# Patient Record
Sex: Male | Born: 1975
Health system: Southern US, Community
[De-identification: ages and names within clinical notes are randomized; demographics above are authoritative.]

## PROBLEM LIST (undated history)

## (undated) ENCOUNTER — Emergency Department (HOSPITAL_COMMUNITY)
Admission: EM | Disposition: A | Discharge status: Still In Hospital | Payer: BC Managed Care – PPO | Source: Home / Self Care

## (undated) DIAGNOSIS — Z87442 Personal history of urinary calculi: Secondary | ICD-10-CM

## (undated) HISTORY — PX: NASAL SINUS SURGERY: SHX719

## (undated) HISTORY — PX: LITHOTRIPSY: SUR834

---

## 2005-05-05 ENCOUNTER — Emergency Department (HOSPITAL_COMMUNITY): Admission: EM | Admit: 2005-05-05 | Discharge: 2005-05-05 | Payer: Self-pay | Admitting: Emergency Medicine

## 2005-05-13 ENCOUNTER — Ambulatory Visit (HOSPITAL_COMMUNITY): Admission: RE | Admit: 2005-05-13 | Discharge: 2005-05-13 | Payer: Self-pay | Admitting: Urology

## 2006-05-16 IMAGING — CR DG ABDOMEN 1V
2 series · 2 of 2 positions shown · non-contrast
Comparison: none

CLINICAL DATA: Preop. 
 FLAT ABDOMEN ? 1 VIEW ([DATE] HOURS):

[view not recorded (1 of 2)]
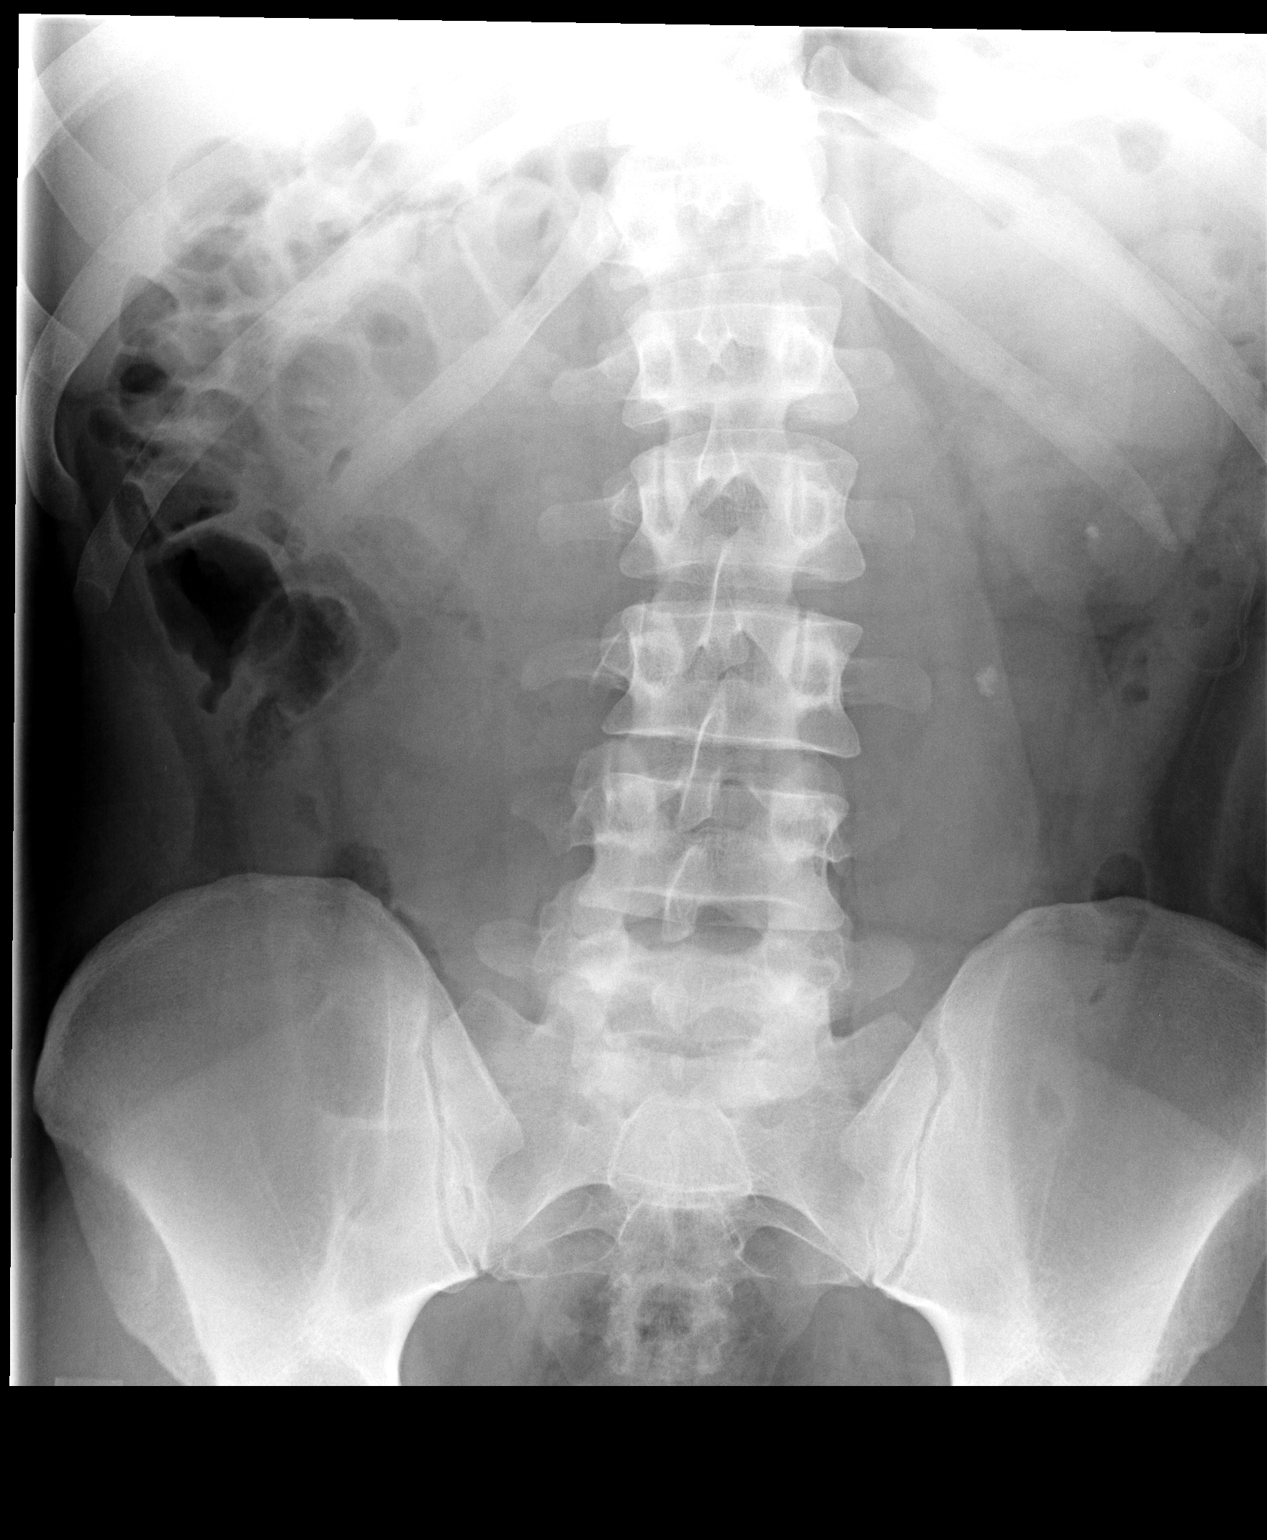

[view not recorded (2 of 2)]
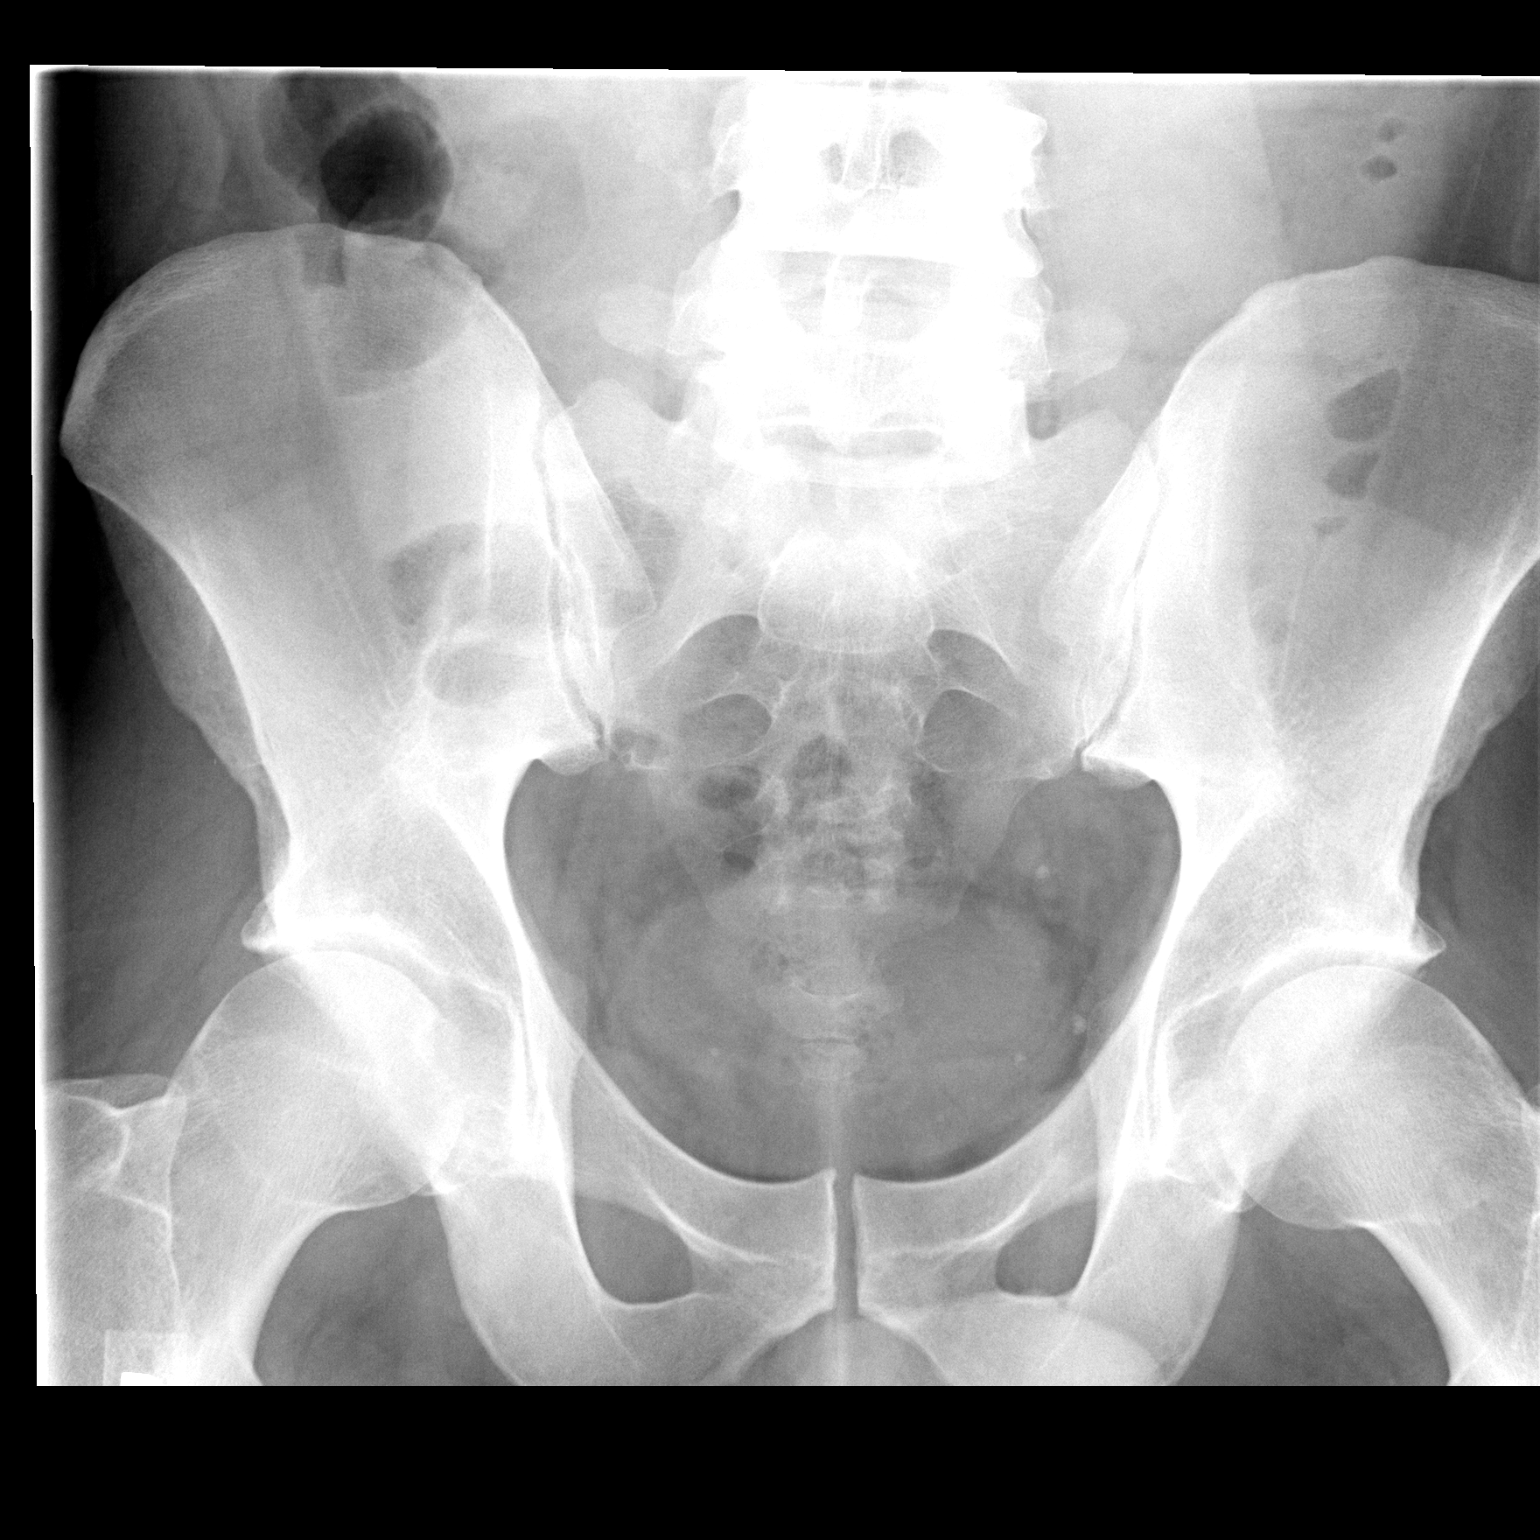

[2 of 2 positions shown; findings below may reference images not displayed]

FINDINGS: An 8 mm calcific density projects over the left ureter at the L-3 transverse process level compatible with the patient?s known left ureteral calculus.  Small calculi are present in the left kidney.  In the lower pole there is a 3 mm calculus.  In the interpolar region there is a 2 mm calculus.  The bowel gas pattern is unremarkable.  Mild degenerative changes are present in the spine.  Probable phleboliths project over the pelvis.
IMPRESSION: Left nephrolithiasis and left ureteral calculus.

## 2006-10-06 ENCOUNTER — Ambulatory Visit (HOSPITAL_COMMUNITY): Admission: RE | Admit: 2006-10-06 | Discharge: 2006-10-06 | Payer: Self-pay | Admitting: Urology

## 2013-02-26 ENCOUNTER — Encounter (HOSPITAL_BASED_OUTPATIENT_CLINIC_OR_DEPARTMENT_OTHER): Payer: Self-pay | Admitting: Emergency Medicine

## 2013-02-26 ENCOUNTER — Emergency Department (HOSPITAL_BASED_OUTPATIENT_CLINIC_OR_DEPARTMENT_OTHER)
Admission: EM | Admit: 2013-02-26 | Discharge: 2013-02-26 | Disposition: A | Discharge status: Home / Self Care | Payer: BC Managed Care – PPO | Attending: Emergency Medicine | Admitting: Emergency Medicine

## 2013-02-26 ENCOUNTER — Emergency Department (HOSPITAL_BASED_OUTPATIENT_CLINIC_OR_DEPARTMENT_OTHER): Payer: BC Managed Care – PPO

## 2013-02-26 DIAGNOSIS — Z87442 Personal history of urinary calculi: Secondary | ICD-10-CM | POA: Insufficient documentation

## 2013-02-26 DIAGNOSIS — S62639B Displaced fracture of distal phalanx of unspecified finger, initial encounter for open fracture: Secondary | ICD-10-CM | POA: Insufficient documentation

## 2013-02-26 DIAGNOSIS — Z79899 Other long term (current) drug therapy: Secondary | ICD-10-CM | POA: Insufficient documentation

## 2013-02-26 DIAGNOSIS — S62626B Displaced fracture of medial phalanx of right little finger, initial encounter for open fracture: Secondary | ICD-10-CM

## 2013-02-26 DIAGNOSIS — Y9389 Activity, other specified: Secondary | ICD-10-CM | POA: Insufficient documentation

## 2013-02-26 DIAGNOSIS — W230XXA Caught, crushed, jammed, or pinched between moving objects, initial encounter: Secondary | ICD-10-CM | POA: Insufficient documentation

## 2013-02-26 DIAGNOSIS — Y9289 Other specified places as the place of occurrence of the external cause: Secondary | ICD-10-CM | POA: Insufficient documentation

## 2013-02-26 DIAGNOSIS — Z23 Encounter for immunization: Secondary | ICD-10-CM | POA: Insufficient documentation

## 2013-02-26 DIAGNOSIS — R209 Unspecified disturbances of skin sensation: Secondary | ICD-10-CM | POA: Insufficient documentation

## 2013-02-26 LAB — BASIC METABOLIC PANEL
CO2: 29 mEq/L (ref 19–32)
GFR calc non Af Amer: >90 mL/min (ref >90)
Glucose, Bld: 101 mg/dL — ABNORMAL HIGH (ref 70–99)
Potassium: 4.4 mEq/L (ref 3.5–5.1)
Sodium: 142 mEq/L (ref 135–145)

## 2013-02-26 LAB — CBC WITH DIFFERENTIAL/PLATELET
Basophils Relative: 0 % (ref 0–1)
Eosinophils Relative: 1 % (ref 0–5)
Hemoglobin: 15.3 g/dL (ref 13.0–17.0)
Lymphocytes Relative: 13 % (ref 12–46)
Neutrophils Relative %: 80 % — ABNORMAL HIGH (ref 43–77)
Platelets: 262 10*3/uL (ref 150–400)
RBC: 4.78 MIL/uL (ref 4.22–5.81)
WBC: 11.2 10*3/uL — ABNORMAL HIGH (ref 4.0–10.5)

## 2013-02-26 MED ORDER — CEPHALEXIN 500 MG PO CAPS: 500.0000 mg | ORAL_CAPSULE | Freq: Four times a day (QID) | ORAL | Status: DC

## 2013-02-26 MED ORDER — OXYCODONE-ACETAMINOPHEN 5-325 MG PO TABS
2.0000 | ORAL_TABLET | Freq: Once | ORAL | Status: DC
  Filled 2013-02-26: qty 2

## 2013-02-26 MED ORDER — OXYCODONE-ACETAMINOPHEN 5-325 MG PO TABS: 1.0000 | ORAL_TABLET | ORAL | Status: DC | PRN

## 2013-02-26 MED ORDER — CEPHALEXIN 250 MG PO CAPS
250.0000 mg | ORAL_CAPSULE | Freq: Once | ORAL | Status: AC
  Administered 2013-02-26: 250 mg via ORAL
  Filled 2013-02-26: qty 1

## 2013-02-26 MED ORDER — TETANUS-DIPHTH-ACELL PERTUSSIS 5-2.5-18.5 LF-MCG/0.5 IM SUSP
INTRAMUSCULAR | Status: AC
  Filled 2013-02-26: qty 0.5

## 2013-02-26 MED ORDER — TETANUS-DIPHTH-ACELL PERTUSSIS 5-2.5-18.5 LF-MCG/0.5 IM SUSP
0.5000 mL | Freq: Once | INTRAMUSCULAR | Status: AC
  Administered 2013-02-26: 0.5 mL via INTRAMUSCULAR

## 2013-02-26 NOTE — ED Notes (Signed)
Smashed right 5th finger between a tractor and another piece of farm equipment.

## 2013-02-26 NOTE — ED Provider Notes (Signed)
History     CSN: [id number]  Arrival date & time 02/26/13  1952   First MD Initiated Contact with Patient 02/26/13 2024      Chief Complaint  Patient presents with  . Finger Injury    (Consider location/radiation/quality/duration/timing/severity/associated sxs/prior treatment) HPI Comments: Patient is a 37 year old male who presents with right 5th finger pain that occurred prior to arrival. The pain started suddenly when the patient had his finger smashed between a tractor and another piece of farm equipment. The pain is throbbing and moderate without radiation. Patient reports overlying laceration of the affected area. Patient reports tingling and numbness to the tip of his right fifth finger. No other injuries. Movement and palpation of the area makes the pain worse. No alleviating factors.    Past Medical History  Diagnosis Date  . Kidney stone     Past Surgical History  Procedure Laterality Date  . Nasal sinus surgery    . Lithotripsy      No family history on file.  History  Substance Use Topics  . Smoking status: Never Smoker   . Smokeless tobacco: Current User  . Alcohol Use: Yes     Comment: 2x/wk      Review of Systems  Musculoskeletal: Positive for arthralgias.  Skin: Positive for wound.  All other systems reviewed and are negative.    Allergies  Shrimp  Home Medications   Current Outpatient Rx  Name  Route  Sig  Dispense  Refill  . lamoTRIgine (LAMICTAL) 100 MG tablet   Oral   Take 100 mg by mouth daily.         . Multiple Vitamin (MULTIVITAMIN WITH MINERALS) TABS   Oral   Take 1 tablet by mouth daily.           BP 135/74  Pulse 62  Temp(Src) 98.5 F (36.9 C) (Oral)  Resp 16  Ht 6' (1.829 m)  Wt 207 lb (93.895 kg)  BMI 28.07 kg/m2  SpO2 100%  Physical Exam  Nursing note and vitals reviewed. Constitutional: He appears well-developed and well-nourished. No distress.  HENT:  Head: Normocephalic and atraumatic.  Eyes:  Conjunctivae are normal.  Neck: Normal range of motion.  Cardiovascular: Normal rate and regular rhythm.  Exam reveals no gallop and no friction rub.   No murmur heard. Pulmonary/Chest: Effort normal and breath sounds normal. He has no wheezes. He has no rales. He exhibits no tenderness.  Musculoskeletal: Normal range of motion.  Neurological: He is alert. Coordination normal.  Patient reports numbness of tip of right fifth finger. Speech is goal-oriented. Moves limbs without ataxia.   Skin: Skin is warm and dry.  1 cm laceration over volar aspect of distal right 5th finger. Tenderness to palpation.   Psychiatric: He has a normal mood and affect. His behavior is normal.    ED Course  Procedures (including critical care time)  Labs Reviewed  CBC WITH DIFFERENTIAL  BASIC METABOLIC PANEL   Dg Finger Little Right  02/26/2013   *RADIOLOGY REPORT*  Clinical Data: Finger injury  RIGHT LITTLE FINGER 2+V  Comparison: None.  Findings: Three views of the right fifth finger submitted.  There is displaced fracture of the distal aspect middle phalanx.  IMPRESSION: Displaced fracture distal aspect of the middle phalanx right fifth finger.   Original Report Authenticated By: Natasha Mead, M.D.     1. Fracture of fifth finger, middle phalanx, right, open, initial encounter       MDM  8:56 PM Xray shows displaced fracture of distal middle phalanx of right fifth finger. Fracture is open. I will call hand surgery for patient disposition.   9:51 PM Dr. Melvyn Novas will surgically repair the fracture tomorrow. Patient will have Keflex. Patient given tetanus shot here. Patient will have wound cleaned and finger splint applied and be discharged.       Emilia Beck, PA-C 02/26/13 2156

## 2013-02-26 NOTE — ED Provider Notes (Signed)
History/physical exam/procedure(s) were performed by non-physician practitioner and as supervising physician I was immediately available for consultation/collaboration. I have reviewed all notes and am in agreement with care and plan.   Kahlee Metivier S Gilberte Gorley, MD 02/26/13 2207 

## 2013-02-27 ENCOUNTER — Ambulatory Visit (HOSPITAL_COMMUNITY)
Admission: RE | Admit: 2013-02-27 | Discharge: 2013-02-27 | Disposition: A | Discharge status: Home / Self Care | Payer: BC Managed Care – PPO | Source: Other Acute Inpatient Hospital | Attending: Orthopedic Surgery | Admitting: Orthopedic Surgery

## 2013-02-27 ENCOUNTER — Encounter (HOSPITAL_COMMUNITY): Payer: Self-pay | Admitting: Anesthesiology

## 2013-02-27 ENCOUNTER — Ambulatory Visit (HOSPITAL_COMMUNITY): Payer: BC Managed Care – PPO | Admitting: Anesthesiology

## 2013-02-27 ENCOUNTER — Encounter (HOSPITAL_COMMUNITY)
Admission: RE | Disposition: A | Discharge status: Home / Self Care | Payer: Self-pay | Source: Other Acute Inpatient Hospital | Attending: Orthopedic Surgery

## 2013-02-27 DIAGNOSIS — X58XXXA Exposure to other specified factors, initial encounter: Secondary | ICD-10-CM | POA: Insufficient documentation

## 2013-02-27 DIAGNOSIS — IMO0002 Reserved for concepts with insufficient information to code with codable children: Secondary | ICD-10-CM | POA: Insufficient documentation

## 2013-02-27 DIAGNOSIS — Z91013 Allergy to seafood: Secondary | ICD-10-CM | POA: Insufficient documentation

## 2013-02-27 DIAGNOSIS — F1010 Alcohol abuse, uncomplicated: Secondary | ICD-10-CM | POA: Insufficient documentation

## 2013-02-27 DIAGNOSIS — F17200 Nicotine dependence, unspecified, uncomplicated: Secondary | ICD-10-CM | POA: Insufficient documentation

## 2013-02-27 DIAGNOSIS — S61209A Unspecified open wound of unspecified finger without damage to nail, initial encounter: Secondary | ICD-10-CM | POA: Insufficient documentation

## 2013-02-27 HISTORY — PX: PERCUTANEOUS PINNING: SHX2209

## 2013-02-27 SURGERY — PINNING, EXTREMITY, PERCUTANEOUS
Anesthesia: General | Site: Finger | Laterality: Right | Wound class: Dirty or Infected

## 2013-02-27 MED ORDER — ONDANSETRON HCL 4 MG/2ML IJ SOLN
INTRAMUSCULAR | Status: DC | PRN
  Administered 2013-02-27: 4 mg via INTRAVENOUS

## 2013-02-27 MED ORDER — 0.9 % SODIUM CHLORIDE (POUR BTL) OPTIME
TOPICAL | Status: DC | PRN
  Administered 2013-02-27: 100 mL

## 2013-02-27 MED ORDER — LIDOCAINE HCL (CARDIAC) 20 MG/ML IV SOLN
INTRAVENOUS | Status: DC | PRN
  Administered 2013-02-27: 60 mg via INTRAVENOUS

## 2013-02-27 MED ORDER — MIDAZOLAM HCL 5 MG/5ML IJ SOLN
INTRAMUSCULAR | Status: DC | PRN
Start: 2013-02-27 — End: 2013-02-27
  Administered 2013-02-27: 2 mg via INTRAVENOUS

## 2013-02-27 MED ORDER — CHLORHEXIDINE GLUCONATE 4 % EX LIQD
60.0000 mL | Freq: Once | CUTANEOUS | Status: DC
  Filled 2013-02-27: qty 60

## 2013-02-27 MED ORDER — CEFAZOLIN SODIUM-DEXTROSE 2-3 GM-% IV SOLR
INTRAVENOUS | Status: AC
  Filled 2013-02-27: qty 50

## 2013-02-27 MED ORDER — BUPIVACAINE HCL (PF) 0.25 % IJ SOLN
INTRAMUSCULAR | Status: DC | PRN
  Administered 2013-02-27: 7 mL

## 2013-02-27 MED ORDER — PROPOFOL 10 MG/ML IV BOLUS
INTRAVENOUS | Status: DC | PRN
  Administered 2013-02-27: 200 mg via INTRAVENOUS

## 2013-02-27 MED ORDER — LACTATED RINGERS IV SOLN
INTRAVENOUS | Status: DC | PRN
  Administered 2013-02-27 (×2): via INTRAVENOUS

## 2013-02-27 MED ORDER — DEXTROSE 5 % IV SOLN
INTRAVENOUS | Status: DC | PRN
  Administered 2013-02-27: 19:00:00 via INTRAVENOUS

## 2013-02-27 MED ORDER — DEXAMETHASONE SODIUM PHOSPHATE 4 MG/ML IJ SOLN
INTRAMUSCULAR | Status: DC | PRN
  Administered 2013-02-27: 4 mg via INTRAVENOUS

## 2013-02-27 MED ORDER — OXYCODONE-ACETAMINOPHEN 10-325 MG PO TABS: 1.0000 | ORAL_TABLET | ORAL | Status: DC | PRN

## 2013-02-27 MED ORDER — CEFAZOLIN SODIUM-DEXTROSE 2-3 GM-% IV SOLR
INTRAVENOUS | Status: DC | PRN
  Administered 2013-02-27: 2 g via INTRAVENOUS

## 2013-02-27 MED ORDER — CEFAZOLIN SODIUM-DEXTROSE 2-3 GM-% IV SOLR
2.0000 g | INTRAVENOUS | Status: DC
  Filled 2013-02-27: qty 50

## 2013-02-27 MED ORDER — FENTANYL CITRATE 0.05 MG/ML IJ SOLN
INTRAMUSCULAR | Status: DC | PRN
  Administered 2013-02-27 (×3): 50 ug via INTRAVENOUS

## 2013-02-27 MED ORDER — HYDROMORPHONE HCL PF 1 MG/ML IJ SOLN: 0.2500 mg | INTRAMUSCULAR | Status: DC | PRN

## 2013-02-27 MED ORDER — DOCUSATE SODIUM 100 MG PO CAPS: 100.0000 mg | ORAL_CAPSULE | Freq: Two times a day (BID) | ORAL | Status: DC

## 2013-02-27 SURGICAL SUPPLY — 37 items
BANDAGE CONFORM 2  STR LF (GAUZE/BANDAGES/DRESSINGS) ×3 IMPLANT
BNDG CMPR 9X4 STRL LF SNTH (GAUZE/BANDAGES/DRESSINGS) ×1
BNDG COHESIVE 1X5 TAN STRL LF (GAUZE/BANDAGES/DRESSINGS) ×3 IMPLANT
BNDG ESMARK 4X9 LF (GAUZE/BANDAGES/DRESSINGS) ×3 IMPLANT
CLOTH BEACON ORANGE TIMEOUT ST (SAFETY) ×3 IMPLANT
CORDS BIPOLAR (ELECTRODE) ×3 IMPLANT
COVER SURGICAL LIGHT HANDLE (MISCELLANEOUS) ×3 IMPLANT
CUFF TOURNIQUET SINGLE 18IN (TOURNIQUET CUFF) ×3 IMPLANT
DRAPE OEC MINIVIEW 54X84 (DRAPES) ×3 IMPLANT
DRSG TUBE GAUZE 1X5YD SZ2 (GAUZE/BANDAGES/DRESSINGS) ×3 IMPLANT
GAUZE XEROFORM 1X8 LF (GAUZE/BANDAGES/DRESSINGS) ×3 IMPLANT
GLOVE BIO SURGEON STRL SZ7.5 (GLOVE) ×3 IMPLANT
GLOVE BIO SURGEON STRL SZ8 (GLOVE) ×3 IMPLANT
GLOVE BIOGEL PI IND STRL 8 (GLOVE) ×2 IMPLANT
GLOVE BIOGEL PI IND STRL 8.5 (GLOVE) ×2 IMPLANT
GLOVE BIOGEL PI INDICATOR 8 (GLOVE) ×1
GLOVE BIOGEL PI INDICATOR 8.5 (GLOVE) ×1
GLOVE SURG ORTHO 8.0 STRL STRW (GLOVE) ×3 IMPLANT
GOWN PREVENTION PLUS XLARGE (GOWN DISPOSABLE) ×3 IMPLANT
GOWN STRL NON-REIN LRG LVL3 (GOWN DISPOSABLE) ×6 IMPLANT
GOWN STRL REIN 3XL LVL4 (GOWN DISPOSABLE) ×3 IMPLANT
K-WIRE .035X5.75  STT K (WIRE) ×9 IMPLANT
K-WIRE .045 CH (WIRE) ×3
KIT BASIN OR (CUSTOM PROCEDURE TRAY) ×3 IMPLANT
KIT ROOM TURNOVER OR (KITS) ×3 IMPLANT
KWIRE .045 CH (WIRE) ×2 IMPLANT
NS IRRIG 1000ML POUR BTL (IV SOLUTION) ×3 IMPLANT
PACK ORTHO EXTREMITY (CUSTOM PROCEDURE TRAY) ×3 IMPLANT
PAD ARMBOARD 7.5X6 YLW CONV (MISCELLANEOUS) ×6 IMPLANT
SPONGE GAUZE 4X4 12PLY (GAUZE/BANDAGES/DRESSINGS) ×3 IMPLANT
SUT MERSILENE 4 0 P 3 (SUTURE) ×6 IMPLANT
SUT PROLENE 4 0 P 3 18 (SUTURE) ×3 IMPLANT
SUT PROLENE 5 0 PS 2 (SUTURE) ×6 IMPLANT
TOWEL OR 17X24 6PK STRL BLUE (TOWEL DISPOSABLE) ×3 IMPLANT
TOWEL OR 17X26 10 PK STRL BLUE (TOWEL DISPOSABLE) ×3 IMPLANT
TUBE CONNECTING 12X1/4 (SUCTIONS) ×3 IMPLANT
TUBE GAUZE SZ 6 (GAUZE/BANDAGES/DRESSINGS) IMPLANT

## 2013-02-27 NOTE — H&P (Signed)
Johnny Dean is an 37 y.o. male.   Chief Complaint: right small finger pain and injury HPI: Pt injured last evening and sustained open middle phalanx fracture Pt here for surgery to fix deformity and open fracture No prior injury to right small finger Received TD and ABx last night  Past Medical History  Diagnosis Date  . Kidney stone     Past Surgical History  Procedure Laterality Date  . Nasal sinus surgery    . Lithotripsy      No family history on file. Social History:  reports that he has never smoked. He uses smokeless tobacco. He reports that  drinks alcohol. He reports that he does not use illicit drugs.  Allergies:  Allergies  Allergen Reactions  . Shrimp (Shellfish Allergy)     Medications Prior to Admission  Medication Sig Dispense Refill  . cephALEXin (KEFLEX) 500 MG capsule Take 1 capsule (500 mg total) by mouth 4 (four) times daily.  40 capsule  0  . lamoTRIgine (LAMICTAL) 100 MG tablet Take 100 mg by mouth daily.      . Multiple Vitamin (MULTIVITAMIN WITH MINERALS) TABS Take 1 tablet by mouth daily.      Marland Kitchen oxyCODONE-acetaminophen (PERCOCET/ROXICET) 5-325 MG per tablet Take 1 tablet by mouth every 4 (four) hours as needed for pain.  6 tablet  0    Results for orders placed during the hospital encounter of 02/26/13 (from the past 48 hour(s))  CBC WITH DIFFERENTIAL     Status: Abnormal   Collection Time    02/26/13 10:00 PM      Result Value Range   WBC 11.2 (*) 4.0 - 10.5 K/uL   RBC 4.78  4.22 - 5.81 MIL/uL   Hemoglobin 15.3  13.0 - 17.0 g/dL   HCT 16.1  09.6 - 04.5 %   MCV 90.8  78.0 - 100.0 fL   MCH 32.0  26.0 - 34.0 pg   MCHC 35.3  30.0 - 36.0 g/dL   RDW 40.9  81.1 - 91.4 %   Platelets 262  150 - 400 K/uL   Neutrophils Relative % 80 (*) 43 - 77 %   Lymphocytes Relative 13  12 - 46 %   Monocytes Relative 6  3 - 12 %   Eosinophils Relative 1  0 - 5 %   Basophils Relative 0  0 - 1 %   Neutro Abs 8.9 (*) 1.7 - 7.7 K/uL   Lymphs Abs 1.5  0.7 - 4.0  K/uL   Monocytes Absolute 0.7  0.1 - 1.0 K/uL   Eosinophils Absolute 0.1  0.0 - 0.7 K/uL   Basophils Absolute 0.0  0.0 - 0.1 K/uL   Smear Review MORPHOLOGY UNREMARKABLE    BASIC METABOLIC PANEL     Status: Abnormal   Collection Time    02/26/13 10:00 PM      Result Value Range   Sodium 142  135 - 145 mEq/L   Potassium 4.4  3.5 - 5.1 mEq/L   Chloride 103  96 - 112 mEq/L   CO2 29  19 - 32 mEq/L   Glucose, Bld 101 (*) 70 - 99 mg/dL   BUN 9  6 - 23 mg/dL   Creatinine, Ser 7.82  0.50 - 1.35 mg/dL   Calcium 95.6  8.4 - 21.3 mg/dL   GFR calc non Af Amer >90  >90 mL/min   GFR calc Af Amer >90  >90 mL/min   Comment:  The eGFR has been calculated     using the CKD EPI equation.     This calculation has not been     validated in all clinical     situations.     eGFR's persistently     <90 mL/min signify     possible Chronic Kidney Disease.   Dg Finger Little Right  02/26/2013   *RADIOLOGY REPORT*  Clinical Data: Finger injury  RIGHT LITTLE FINGER 2+V  Comparison: None.  Findings: Three views of the right fifth finger submitted.  There is displaced fracture of the distal aspect middle phalanx.  IMPRESSION: Displaced fracture distal aspect of the middle phalanx right fifth finger.   Original Report Authenticated By: Natasha Mead, M.D.    NO RECENT ILLNESSES OR HOSPITALIZATIONS  Blood pressure 117/64, pulse 64, temperature 98.3 F (36.8 C), temperature source Oral, SpO2 96.00%. General Appearance:  Alert, cooperative, no distress, appears stated age  Head:  Normocephalic, without obvious abnormality, atraumatic  Eyes:  Pupils equal, conjunctiva/corneas clear,         Throat: Lips, mucosa, and tongue normal; teeth and gums normal  Neck: No visible masses     Lungs:   respirations unlabored  Chest Wall:  No tenderness or deformity  Heart:  Regular rate and rhythm,  Abdomen:   Soft, non-tender,         Extremities: RIGHT SMALL FINGER IN BANDAGE, LIMITED MOBILITY NO INJURY TO  LONG,RING,INDEX FINGER TIP WARM WELL PERFUSED  Pulses: 2+ and symmetric  Skin: Skin color, texture, turgor normal, no rashes or lesions     Neurologic: Normal    Assessment/Plan RIGHT SMALL FINGER OPEN MIDDLE PHALANX FRACTURE  RIGHT SMALL FINGER OPEN DEBRIDEMENT AND REPAIR AS INDICATED  R/B/A DISCUSSED WITH PT IN HOLDING AREA.  PT VOICED UNDERSTANDING OF PLAN CONSENT SIGNED DAY OF SURGERY PT SEEN AND EXAMINED PRIOR TO OPERATIVE PROCEDURE/DAY OF SURGERY SITE MARKED. QUESTIONS ANSWERED WILL GO HOME FOLLOWING SURGERY  Sharma Covert 02/27/2013, 7:48 PM

## 2013-02-27 NOTE — Anesthesia Preprocedure Evaluation (Addendum)
Anesthesia Evaluation   Patient awake    Reviewed: Allergy & Precautions, H&P , NPO status , Patient's Chart, lab work & pertinent test results  Airway Mallampati: II      Dental   Pulmonary neg pulmonary ROS, Current Smoker (smokeless tobacco),          Cardiovascular negative cardio ROS  Rhythm:Regular Rate:Normal     Neuro/Psych S/p brain tumor resection  negative neurological ROS  negative psych ROS   GI/Hepatic negative GI ROS, Neg liver ROS, (+)     substance abuse  alcohol use,   Endo/Other  negative endocrine ROS  Renal/GU Renal diseaseHistory of kidneys stones noted.   Kidney stones    Musculoskeletal   Abdominal   Peds  Hematology negative hematology ROS (+)   Anesthesia Other Findings   Reproductive/Obstetrics negative OB ROS                      Anesthesia Physical Anesthesia Plan  ASA: II  Anesthesia Plan: General   Post-op Pain Management:    Induction:   Airway Management Planned: LMA  Additional Equipment:   Intra-op Plan:   Post-operative Plan: Extubation in OR  Informed Consent: I have reviewed the patients History and Physical, chart, labs and discussed the procedure including the risks, benefits and alternatives for the proposed anesthesia with the patient or authorized representative who has indicated his/her understanding and acceptance.   Dental advisory given  Plan Discussed with: CRNA, Anesthesiologist and Surgeon  Anesthesia Plan Comments:        Anesthesia Quick Evaluation

## 2013-02-27 NOTE — Brief Op Note (Signed)
02/27/2013  7:50 PM  PATIENT:  Johnny Dean  37 y.o. male  PRE-OPERATIVE DIAGNOSIS:  right small finger fracture  POST-OPERATIVE DIAGNOSIS: SAME  PROCEDURE:  RIGHT SMALL FINGER OPEN REDUCTION AND INTERNAL FIXATION  SURGEON:  Surgeon(s) and Role:    * Sharma Covert, MD - Primary  PHYSICIAN ASSISTANT: NONE  ASSISTANTS: none   ANESTHESIA:   general  EBL:   NONE  BLOOD ADMINISTERED:none  DRAINS: none   LOCAL MEDICATIONS USED:  MARCAINE     SPECIMEN:  No Specimen  DISPOSITION OF SPECIMEN:  N/A  COUNTS:  YES  TOURNIQUET:    DICTATION: .284132  PLAN OF CARE: Discharge to home after PACU  PATIENT DISPOSITION:  PACU - hemodynamically stable.   Delay start of Pharmacological VTE agent (>24hrs) due to surgical blood loss or risk of bleeding: not applicable

## 2013-02-27 NOTE — Preoperative (Signed)
Beta Blockers   Reason not to administer Beta Blockers:Not Applicable 

## 2013-02-27 NOTE — Anesthesia Postprocedure Evaluation (Signed)
  Anesthesia Post-op Note  Patient: Johnny Dean  Procedure(s) Performed: Procedure(s): PERCUTANEOUS PINNING EXTREMITY (Right)  Patient Location: PACU  Anesthesia Type:General  Level of Consciousness: awake  Airway and Oxygen Therapy: Patient Spontanous Breathing  Post-op Pain: mild  Post-op Assessment: Post-op Vital signs reviewed  Post-op Vital Signs: Reviewed  Complications: No apparent anesthesia complications

## 2013-02-27 NOTE — Op Note (Deleted)
NAME:  Johnny Dean, Johnny Dean           ACCOUNT NO.:  [id number]  MEDICAL RECORD NO.:  [id number]  LOCATION:  [facility]                         FACILITY:  [facility]  PHYSICIAN:  Craige Patel W Lenette Rau IV, MD  DATE OF BIRTH:  02/07/1976  DATE OF PROCEDURE:  02/27/2013 DATE OF DISCHARGE:  02/27/2013                              OPERATIVE REPORT   PREOPERATIVE DIAGNOSES: 1. Right small finger open middle phalanx fracture 2. Right small finger extensor tendon laceration, zone I/II.  POSTOPERATIVE DIAGNOSES: 1. Right small finger open middle phalanx fracture. 2. Right small finger extensor tendon laceration, zone I/II.  ATTENDING PHYSICIAN:  Miral Hoopes W Korrie Hofbauer IV, MD, who scrubbed and present for the entire procedure.  ASSISTANT SURGEON:  None.  ANESTHESIA:  General via LMA.  TOURNIQUET TIME:  One hour at 250 mmHg.  SURGICAL IMPLANTS:  Two 0.035 K-wires.  SURGICAL PROCEDURE: 1. Open debridement of skin, subcutaneous tissue, and bone associated     with open fracture, right small finger. 2. Open treatment of right small finger, middle phalanx fracture     involving the articular surface, and distal interphalangeal joint. 3. Right small finger zone I/II extensor tendon repair. 4. Radiograph 2 views, right small finger. 5. Traumatic laceration repair, 2 cm volar wound.  SURGICAL INDICATIONS:  Mr. Johnny Dean is a right-hand-dominant gentleman, who sustained a crushing injury to his right small finger. The patient was seen and evaluated and given displacement of the nature of his injury, I recommended to undergo the above procedure.  Risks, benefits, and alternatives were discussed in detail with the patient. Signed informed consent was obtained.  Risks include, but not limited to bleeding, infection, damage to nearby nerves, arteries, or tendons, loss of motion of wrist and digits, incomplete relief of symptoms, nonunion, malunion, hardware failure, and need for further surgical  intervention.  DESCRIPTION OF PROCEDURE:  The patient was properly identified in the preop holding area.  Mark with permanent marker made on the right small finger to indicate the correct operative site.  The patient was then brought back to operating room, placed supine on the anesthesia room table.  General anesthesia was administered.  The patient received preoperative antibiotics prior to skin incision.  A well-padded tourniquet was then placed in the right brachium and sealed with 1000 drape.  Right upper extremity was then prepped and draped in normal sterile fashion.  Time-out was called.  Correct side was identified and procedure then begun.  Attention then turned to the right small finger where a Z-type incision made directly over the dorsal aspect of the distal interphalangeal joint.  The patient did have a transverse laceration that was extended both proximally and distally on this side. The dissection was then carried down through the skin and subcutaneous tissue.  The patient did have the extensor tendon laceration zone I/II just proximal to the distal interphalangeal joint.  Once this was carried out, the wound was then thoroughly irrigated.  Excisional debridement was then carried out of the skin and subcutaneous tissue and bone associated with the open fractures with excisional debridement carried out with small curettes, rongeurs, and knife.  After open debridement of open fracture, an open reduction was then performed.  A cross   0.035 K-wire was then placed in crossed fashion.  Following this, a 0.035 K-wire was then placed retrograde from the distal phalanx across the distal interphalangeal joint into the middle phalanx.  This obtained good reduction.  Following this, the wound was then irrigated.  The extensor mechanism was then repaired with 4-0 Mersilene suture, figure- of-eight sutures.  Final radiographs were then obtained.  K-wires were then cut and bent and a  crossing K-wire was left out of the skin.  The longitudinal K-wire was cut beneath the skin.  The volar wound, traumatic wound was then closed.  A 2 cm wound was then closed with simple Prolene sutures.  Xeroform dressings were applied.  Sterile compressive bandage applied.  The patient was placed in a finger splint, 7 mL of 0.25% Marcaine was infiltrated locally.  The patient was then extubated and taken to recovery room in good condition.  Postoperative radiographs, 2 views of the finger do show the K-wires fixation in place.  There was good position in both planes.  POSTPROCEDURE PLAN:  The patient will be discharged to home, seen back in the office in approximately 10 days for wound check, likely suture removal, x-rays, and then may be sent down to see our therapist for a small finger tip protector splint.  These were the cross K-wire out of the 4 week mark, longitudinal K-wire out at the 6-8 week mark, radiographs at each visit.  I then begin a zone I extensor tendon repair protocol once the K-wire was taken out.     Michael Walrath W Rashaun Wichert IV, MD     FWO/MEDQ  D:  02/27/2013  T:  02/27/2013  Job:  [id number] 

## 2013-02-27 NOTE — Transfer of Care (Signed)
Immediate Anesthesia Transfer of Care Note  Patient: Johnny Dean  Procedure(s) Performed: Procedure(s): PERCUTANEOUS PINNING EXTREMITY (Right)  Patient Location: PACU  Anesthesia Type:General  Level of Consciousness: awake, alert , oriented and patient cooperative  Airway & Oxygen Therapy: Patient Spontanous Breathing and Patient connected to nasal cannula oxygen  Post-op Assessment: Report given to PACU RN and Post -op Vital signs reviewed and stable  Post vital signs: Reviewed and stable  Complications: No apparent anesthesia complications

## 2013-02-27 NOTE — Op Note (Signed)
NAME:  Johnny Dean, Johnny Dean NO.:  [id number]  MEDICAL RECORD NO.:  [id number]  LOCATION:  [facility]                         FACILITY:  [facility]  PHYSICIAN:  Madelynn Done, MD  DATE OF BIRTH:  03/01/1976  DATE OF PROCEDURE:  02/27/2013 DATE OF DISCHARGE:  02/27/2013                              OPERATIVE REPORT   PREOPERATIVE DIAGNOSES: 1. Right small finger open middle phalanx fracture 2. Right small finger extensor tendon laceration, zone I/II.  POSTOPERATIVE DIAGNOSES: 1. Right small finger open middle phalanx fracture. 2. Right small finger extensor tendon laceration, zone I/II.  ATTENDING PHYSICIAN:  Madelynn Done, MD, who scrubbed and present for the entire procedure.  ASSISTANT SURGEON:  None.  ANESTHESIA:  General via LMA.  TOURNIQUET TIME:  One hour at 250 mmHg.  SURGICAL IMPLANTS:  Two 0.035 K-wires.  SURGICAL PROCEDURE: 1. Open debridement of skin, subcutaneous tissue, and bone associated     with open fracture, right small finger. 2. Open treatment of right small finger, middle phalanx fracture     involving the articular surface, and distal interphalangeal joint. 3. Right small finger zone I/II extensor tendon repair. 4. Radiograph 2 views, right small finger. 5. Traumatic laceration repair, 2 cm volar wound.  SURGICAL INDICATIONS:  Johnny Dean is a right-hand-dominant gentleman, who sustained a crushing injury to his right small finger. The patient was seen and evaluated and given displacement of the nature of his injury, I recommended to undergo the above procedure.  Risks, benefits, and alternatives were discussed in detail with the patient. Signed informed consent was obtained.  Risks include, but not limited to bleeding, infection, damage to nearby nerves, arteries, or tendons, loss of motion of wrist and digits, incomplete relief of symptoms, nonunion, malunion, hardware failure, and need for further surgical  intervention.  DESCRIPTION OF PROCEDURE:  The patient was properly identified in the preop holding area.  Mark with permanent marker made on the right small finger to indicate the correct operative site.  The patient was then brought back to operating room, placed supine on the anesthesia room table.  General anesthesia was administered.  The patient received preoperative antibiotics prior to skin incision.  A well-padded tourniquet was then placed in the right brachium and sealed with 1000 drape.  Right upper extremity was then prepped and draped in normal sterile fashion.  Time-out was called.  Correct side was identified and procedure then begun.  Attention then turned to the right small finger where a Z-type incision made directly over the dorsal aspect of the distal interphalangeal joint.  The patient did have a transverse laceration that was extended both proximally and distally on this side. The dissection was then carried down through the skin and subcutaneous tissue.  The patient did have the extensor tendon laceration zone I/II just proximal to the distal interphalangeal joint.  Once this was carried out, the wound was then thoroughly irrigated.  Excisional debridement was then carried out of the skin and subcutaneous tissue and bone associated with the open fractures with excisional debridement carried out with small curettes, rongeurs, and knife.  After open debridement of open fracture, an open reduction was then performed.  A cross  0.035 K-wire was then placed in crossed fashion.  Following this, a 0.035 K-wire was then placed retrograde from the distal phalanx across the distal interphalangeal joint into the middle phalanx.  This obtained good reduction.  Following this, the wound was then irrigated.  The extensor mechanism was then repaired with 4-0 Mersilene suture, figure- of-eight sutures.  Final radiographs were then obtained.  K-wires were then cut and bent and a  crossing K-wire was left out of the skin.  The longitudinal K-wire was cut beneath the skin.  The volar wound, traumatic wound was then closed.  A 2 cm wound was then closed with simple Prolene sutures.  Xeroform dressings were applied.  Sterile compressive bandage applied.  The patient was placed in a finger splint, 7 mL of 0.25% Marcaine was infiltrated locally.  The patient was then extubated and taken to recovery room in good condition.  Postoperative radiographs, 2 views of the finger do show the K-wires fixation in place.  There was good position in both planes.  POSTPROCEDURE PLAN:  The patient will be discharged to home, seen back in the office in approximately 10 days for wound check, likely suture removal, x-rays, and then may be sent down to see our therapist for a small finger tip protector splint.  These were the cross K-wire out of the 4 week mark, longitudinal K-wire out at the 6-8 week mark, radiographs at each visit.  I then begin a zone I extensor tendon repair protocol once the K-wire was taken out.     Madelynn Done, MD     FWO/MEDQ  D:  02/27/2013  T:  02/27/2013  Job:  [id number]

## 2013-02-27 NOTE — Anesthesia Procedure Notes (Signed)
Procedure Name: LMA Insertion Date/Time: 02/27/2013 7:48 PM Performed by: Julianne Rice Z Pre-anesthesia Checklist: Patient identified, Timeout performed, Emergency Drugs available, Suction available and Patient being monitored Patient Re-evaluated:Patient Re-evaluated prior to inductionOxygen Delivery Method: Circle system utilized Preoxygenation: Pre-oxygenation with 100% oxygen Intubation Type: IV induction Ventilation: Mask ventilation without difficulty LMA: LMA inserted LMA Size: 4.0 Number of attempts: 1 Placement Confirmation: breath sounds checked- equal and bilateral and positive ETCO2 Tube secured with: Tape Dental Injury: Teeth and Oropharynx as per pre-operative assessment

## 2013-03-02 ENCOUNTER — Encounter (HOSPITAL_COMMUNITY): Payer: Self-pay | Admitting: Orthopedic Surgery

## 2013-08-04 ENCOUNTER — Ambulatory Visit (INDEPENDENT_AMBULATORY_CARE_PROVIDER_SITE_OTHER): Payer: BC Managed Care – PPO | Admitting: General Practice

## 2013-08-04 ENCOUNTER — Ambulatory Visit (INDEPENDENT_AMBULATORY_CARE_PROVIDER_SITE_OTHER): Payer: BC Managed Care – PPO

## 2013-08-04 VITALS — BP 132/73 | HR 67 | Temp 97.6°F | Ht 72.0 in | Wt 218.0 lb

## 2013-08-04 DIAGNOSIS — M5430 Sciatica, unspecified side: Secondary | ICD-10-CM

## 2013-08-04 DIAGNOSIS — M545 Low back pain: Secondary | ICD-10-CM

## 2013-08-04 DIAGNOSIS — M5431 Sciatica, right side: Secondary | ICD-10-CM

## 2013-08-04 DIAGNOSIS — S335XXA Sprain of ligaments of lumbar spine, initial encounter: Secondary | ICD-10-CM

## 2013-08-04 DIAGNOSIS — S39012A Strain of muscle, fascia and tendon of lower back, initial encounter: Secondary | ICD-10-CM

## 2013-08-04 MED ORDER — METHYLPREDNISOLONE ACETATE 80 MG/ML IJ SUSP
80.0000 mg | Freq: Once | INTRAMUSCULAR | Status: AC
  Administered 2013-08-04: 80 mg via INTRAMUSCULAR

## 2013-08-04 MED ORDER — PREDNISONE (PAK) 10 MG PO TABS: ORAL_TABLET | ORAL | Status: DC

## 2013-08-04 MED ORDER — CYCLOBENZAPRINE HCL 5 MG PO TABS: 5.0000 mg | ORAL_TABLET | Freq: Two times a day (BID) | ORAL | Status: DC | PRN

## 2013-08-04 MED ORDER — IBUPROFEN 800 MG PO TABS: 800.0000 mg | ORAL_TABLET | Freq: Three times a day (TID) | ORAL | Status: DC | PRN

## 2013-08-04 MED ORDER — CYCLOBENZAPRINE HCL 10 MG PO TABS: 10.0000 mg | ORAL_TABLET | Freq: Two times a day (BID) | ORAL | Status: DC | PRN

## 2013-08-04 NOTE — Patient Instructions (Signed)
Sciatica °Sciatica is pain, weakness, numbness, or tingling along the path of the sciatic nerve. The nerve starts in the lower back and runs down the back of each leg. The nerve controls the muscles in the lower leg and in the back of the knee, while also providing sensation to the back of the thigh, lower leg, and the sole of your foot. Sciatica is a symptom of another medical condition. For instance, nerve damage or certain conditions, such as a herniated disk or bone spur on the spine, pinch or put pressure on the sciatic nerve. This causes the pain, weakness, or other sensations normally associated with sciatica. Generally, sciatica only affects one side of the body. °CAUSES  °· Herniated or slipped disc. °· Degenerative disk disease. °· A pain disorder involving the narrow muscle in the buttocks (piriformis syndrome). °· Pelvic injury or fracture. °· Pregnancy. °· Tumor (rare). °SYMPTOMS  °Symptoms can vary from mild to very severe. The symptoms usually travel from the low back to the buttocks and down the back of the leg. Symptoms can include: °· Mild tingling or dull aches in the lower back, leg, or hip. °· Numbness in the back of the calf or sole of the foot. °· Burning sensations in the lower back, leg, or hip. °· Sharp pains in the lower back, leg, or hip. °· Leg weakness. °· Severe back pain inhibiting movement. °These symptoms may get worse with coughing, sneezing, laughing, or prolonged sitting or standing. Also, being overweight may worsen symptoms. °DIAGNOSIS  °Your caregiver will perform a physical exam to look for common symptoms of sciatica. He or she may ask you to do certain movements or activities that would trigger sciatic nerve pain. Other tests may be performed to find the cause of the sciatica. These may include: °· Blood tests. °· X-rays. °· Imaging tests, such as an MRI or CT scan. °TREATMENT  °Treatment is directed at the cause of the sciatic pain. Sometimes, treatment is not necessary  and the pain and discomfort goes away on its own. If treatment is needed, your caregiver may suggest: °· Over-the-counter medicines to relieve pain. °· Prescription medicines, such as anti-inflammatory medicine, muscle relaxants, or narcotics. °· Applying heat or ice to the painful area. °· Steroid injections to lessen pain, irritation, and inflammation around the nerve. °· Reducing activity during periods of pain. °· Exercising and stretching to strengthen your abdomen and improve flexibility of your spine. Your caregiver may suggest losing weight if the extra weight makes the back pain worse. °· Physical therapy. °· Surgery to eliminate what is pressing or pinching the nerve, such as a bone spur or part of a herniated disk. °HOME CARE INSTRUCTIONS  °· Only take over-the-counter or prescription medicines for pain or discomfort as directed by your caregiver. °· Apply ice to the affected area for 20 minutes, 3 4 times a day for the first 48 72 hours. Then try heat in the same way. °· Exercise, stretch, or perform your usual activities if these do not aggravate your pain. °· Attend physical therapy sessions as directed by your caregiver. °· Keep all follow-up appointments as directed by your caregiver. °· Do not wear high heels or shoes that do not provide proper support. °· Check your mattress to see if it is too soft. A firm mattress may lessen your pain and discomfort. °SEEK IMMEDIATE MEDICAL CARE IF:  °· You lose control of your bowel or bladder (incontinence). °· You have increasing weakness in the lower back,   pelvis, buttocks, or legs. °· You have redness or swelling of your back. °· You have a burning sensation when you urinate. °· You have pain that gets worse when you lie down or awakens you at night. °· Your pain is worse than you have experienced in the past. °· Your pain is lasting longer than 4 weeks. °· You are suddenly losing weight without reason. °MAKE SURE YOU: °· Understand these  instructions. °· Will watch your condition. °· Will get help right away if you are not doing well or get worse. °Document Released: 08/27/2001 Document Revised: 03/03/2012 Document Reviewed: 01/12/2012 °Exit[employer]® Patient Information ©2014 [employer]. ° °

## 2013-08-04 NOTE — Progress Notes (Signed)
  Subjective:    Patient ID: Johnny Dean, male    DOB: 03/08/1976, 37 y.o.   MRN: [id number]  Back Pain This is a new problem. The current episode started in the past 7 days. The problem occurs constantly. The problem has been gradually worsening since onset. The pain is present in the lumbar spine. The quality of the pain is described as aching. The pain radiates to the right thigh. The pain is at a severity of 7/10. The symptoms are aggravated by bending and twisting. Associated symptoms include tingling. Pertinent negatives include no abdominal pain, bladder incontinence, bowel incontinence, chest pain, dysuria, fever, leg pain, numbness, pelvic pain or weakness. He has tried heat, NSAIDs and ice for the symptoms.      Review of Systems  Constitutional: Negative for fever and chills.  Respiratory: Negative for chest tightness and shortness of breath.   Cardiovascular: Negative for chest pain and palpitations.  Gastrointestinal: Negative for abdominal pain and bowel incontinence.  Genitourinary: Negative for bladder incontinence, dysuria, difficulty urinating and pelvic pain.  Musculoskeletal: Positive for back pain and myalgias.       Low back pain  Neurological: Positive for tingling. Negative for dizziness, weakness and numbness.       Objective:   Physical Exam  Constitutional: He is oriented to person, place, and time. He appears well-developed and well-nourished.  Cardiovascular: Normal rate, regular rhythm and normal heart sounds.   Pulmonary/Chest: Effort normal and breath sounds normal.  Abdominal: Soft. Bowel sounds are normal. He exhibits no distension. There is no tenderness.  Musculoskeletal: He exhibits tenderness.  Lumbar area tenderness and tightness noted with palpation  Neurological: He is alert and oriented to person, place, and time.  Skin: Skin is warm and dry.  Psychiatric: He has a normal mood and affect.     [facility] reading (PRIMARY) by Coralie Keens,  FNP-C, no acuteprocess noted     Assessment & Plan:  1. Low back pain  - DG Lumbar Spine Complete; Future - ibuprofen (ADVIL,MOTRIN) 800 MG tablet; Take 1 tablet (800 mg total) by mouth every 8 (eight) hours as needed.  Dispense: 30 tablet; Refill: 0  2. Sciatica, right  - methylPREDNISolone acetate (DEPO-MEDROL) injection 80 mg; Inject 1 mL (80 mg total) into the muscle once. - predniSONE (STERAPRED UNI-PAK) 10 MG tablet; Start on 08/05/13  Dispense: 21 tablet; Refill: 0  3. Strain of lumbar paraspinal muscle, initial encounter  - cyclobenzaprine (FLEXERIL) 5 MG tablet; Take 1 tablet (5 mg total) by mouth 2 (two) times daily as needed for muscle spasms.  Dispense: 30 tablet; Refill: 0 -sedation precaution discussed, patient verbalized understanding of no operating heavy machinery or driving vehicles -rest affected area -RTO if symptoms worsen or unresolved, will refer to ortho specialist -Patient verbalized understanding -Coralie Keens, FNP-C

## 2013-12-07 ENCOUNTER — Ambulatory Visit (INDEPENDENT_AMBULATORY_CARE_PROVIDER_SITE_OTHER): Payer: BC Managed Care – PPO

## 2013-12-07 ENCOUNTER — Encounter (INDEPENDENT_AMBULATORY_CARE_PROVIDER_SITE_OTHER): Payer: Self-pay

## 2013-12-07 ENCOUNTER — Ambulatory Visit (INDEPENDENT_AMBULATORY_CARE_PROVIDER_SITE_OTHER): Payer: BC Managed Care – PPO | Admitting: Nurse Practitioner

## 2013-12-07 VITALS — BP 118/82 | HR 72 | Temp 98.5°F | Ht 72.0 in | Wt 217.0 lb

## 2013-12-07 DIAGNOSIS — R05 Cough: Secondary | ICD-10-CM

## 2013-12-07 DIAGNOSIS — R059 Cough, unspecified: Secondary | ICD-10-CM

## 2013-12-07 DIAGNOSIS — R5081 Fever presenting with conditions classified elsewhere: Secondary | ICD-10-CM

## 2013-12-07 DIAGNOSIS — R52 Pain, unspecified: Secondary | ICD-10-CM

## 2013-12-07 DIAGNOSIS — N410 Acute prostatitis: Secondary | ICD-10-CM

## 2013-12-07 LAB — POCT CBC
GRANULOCYTE PERCENT: 84 % — AB (ref 37–80)
HEMATOCRIT: 47.2 % (ref 43.5–53.7)
HEMOGLOBIN: 14.9 g/dL (ref 14.1–18.1)
LYMPH, POC: 1.7 (ref 0.6–3.4)
MCH: 28.9 pg (ref 27–31.2)
MCHC: 31.6 g/dL — AB (ref 31.8–35.4)
MCV: 91.4 fL (ref 80–97)
MPV: 8.3 fL (ref 0–99.8)
PLATELET COUNT, POC: 236 10*3/uL (ref 142–424)
POC GRANULOCYTE: 11.3 — AB (ref 2–6.9)
POC LYMPH PERCENT: 12.5 %L (ref 10–50)
RBC: 5.2 M/uL (ref 4.69–6.13)
RDW, POC: 13.1 %
WBC: 13.4 10*3/uL — AB (ref 4.6–10.2)

## 2013-12-07 LAB — POCT INFLUENZA A/B
INFLUENZA B, POC: NEGATIVE
Influenza A, POC: NEGATIVE

## 2013-12-07 LAB — POCT URINALYSIS DIPSTICK
BILIRUBIN UA: NEGATIVE
Blood, UA: NEGATIVE
GLUCOSE UA: NEGATIVE
Ketones, UA: NEGATIVE
Leukocytes, UA: NEGATIVE
Nitrite, UA: NEGATIVE
PH UA: 7
Protein, UA: NEGATIVE
Spec Grav, UA: <=1.005
Urobilinogen, UA: NEGATIVE

## 2013-12-07 LAB — POCT UA - MICROSCOPIC ONLY
BACTERIA, U MICROSCOPIC: NEGATIVE
CASTS, UR, LPF, POC: NEGATIVE
CRYSTALS, UR, HPF, POC: NEGATIVE
RBC, URINE, MICROSCOPIC: NEGATIVE
Yeast, UA: NEGATIVE

## 2013-12-07 MED ORDER — SULFAMETHOXAZOLE-TMP DS 800-160 MG PO TABS: 1.0000 | ORAL_TABLET | Freq: Two times a day (BID) | ORAL | Status: DC

## 2013-12-07 NOTE — Progress Notes (Signed)
Subjective:    Patient ID: Johnny Dean, male    DOB: 1976/05/09, 38 y.o.   MRN: [id number]  HPI Patient presents today complaining of body aches for past several weeks. Associated symptoms include chills, night sweats, cough, headaches, fatigue, and congestion. Symptoms wax and wane but have gotten worse recently. Advil is only treatment tried for headache.    Review of Systems  Constitutional: Positive for fever, chills, diaphoresis and fatigue.  HENT: Positive for congestion and rhinorrhea. Negative for ear pain and sore throat.   Respiratory: Positive for cough. Negative for shortness of breath.   Cardiovascular: Negative for chest pain.  Gastrointestinal: Negative for nausea.  Genitourinary: Negative for frequency and difficulty urinating.  Neurological: Positive for weakness.  All other systems reviewed and are negative.       Objective:   Physical Exam  Constitutional: He is oriented to person, place, and time. He appears well-developed and well-nourished.  HENT:  Head: Normocephalic.  Nose: Right sinus exhibits no maxillary sinus tenderness and no frontal sinus tenderness. Left sinus exhibits no maxillary sinus tenderness and no frontal sinus tenderness.  Mouth/Throat: Posterior oropharyngeal erythema present.  cerumen impaction bil   Neck: Normal range of motion. Neck supple.  Cardiovascular: Normal rate and regular rhythm.   Pulmonary/Chest: Effort normal and breath sounds normal. He has no wheezes.  Lymphadenopathy:    He has no cervical adenopathy.  Neurological: He is alert and oriented to person, place, and time.  Skin: Skin is warm and dry.  Psychiatric: He has a normal mood and affect. His behavior is normal. Judgment and thought content normal.     BP 118/82  Pulse 72  Temp(Src) 98.5 F (36.9 C) (Oral)  Ht 6' (1.829 m)  Wt 217 lb (98.431 kg)  BMI 29.42 kg/m2  Chest X-Ray: No infiltrates present Preliminary reading by Paulene Floor, FNP   [facility] Results for orders placed in visit on 12/07/13  POCT INFLUENZA A/B      Result Value Ref Range   Influenza A, POC Negative     Influenza B, POC Negative    POCT UA - MICROSCOPIC ONLY      Result Value Ref Range   WBC, Ur, HPF, POC 5-10     RBC, urine, microscopic neg     Bacteria, U Microscopic neg     Mucus, UA mod     Epithelial cells, urine per micros occ     Crystals, Ur, HPF, POC neg     Casts, Ur, LPF, POC neg     Yeast, UA neg    POCT URINALYSIS DIPSTICK      Result Value Ref Range   Color, UA yellow     Clarity, UA cloudy     Glucose, UA neg     Bilirubin, UA neg     Ketones, UA neg     Spec Grav, UA <=1.005     Blood, UA neg     pH, UA 7.0     Protein, UA neg     Urobilinogen, UA negative     Nitrite, UA neg     Leukocytes, UA Negative         Assessment & Plan:   1. Body aches   2. Prostatitis, acute    Meds ordered this encounter  Medications  . sulfamethoxazole-trimethoprim (BACTRIM DS) 800-160 MG per tablet    Sig: Take 1 tablet by mouth 2 (two) times daily.    Dispense:  56 tablet  Refill:  0    Order Specific Question:  Supervising Provider    Answer:  Ernestina PennaMOORE, DONALD W [[id number]]   Force fluids Take all of antibiotic RTO after completing treatment for CPE  Mary-Margaret Daphine DeutscherMartin, FNP

## 2013-12-07 NOTE — Patient Instructions (Signed)
Prostatitis The prostate gland is about the size and shape of a walnut. It is located just below your bladder. It produces one of the components of semen, which is made up of sperm and the fluids that help nourish and transport it out from the testicles. Prostatitis is inflammation of the prostate gland.  There are four types of prostatitis:  Acute bacterial prostatitis This is the least common type of prostatitis. It starts quickly and usually is associated with a bladder infection, high fever, and shaking chills. It can occur at any age.  Chronic bacterial prostatitis This is a persistent bacterial infection in the prostate. It usually develops from repeated acute bacterial prostatitis or acute bacterial prostatitis that was not properly treated. It can occur in men of any age but is most common in middle-aged men whose prostate has begun to enlarge. The symptoms are not as severe as those in acute bacterial prostatitis. Discomfort in the part of your body that is in front of your rectum and below your scrotum (perineum), lower abdomen, or in the head of your penis (glans) may represent your primary discomfort.  Chronic prostatitis (nonbacterial) This is the most common type of prostatitis. It is inflammation of the prostate gland that is not caused by a bacterial infection. The cause is unknown and may be associated with a viral infection or autoimmune disorder.  Prostatodynia (pelvic floor disorder) This is associated with increased muscular tone in the pelvis surrounding the prostate. CAUSES The causes of bacterial prostatitis are bacterial infection. The causes of the other types of prostatitis are unknown.  SYMPTOMS  Symptoms can vary depending upon the type of prostatitis that exists. There can also be overlap in symptoms. Possible symptoms for each type of prostatitis are listed below. Acute Bacterial Prostatitis  Painful urination.  Fever or chills.  Muscle or joint pains.  Low  back pain.  Low abdominal pain.  Inability to empty bladder completely. Chronic Bacterial Prostatitis, Chronic Nonbacterial Prostatitis, and Prostatodynia  Sudden urge to urinate.  Frequent urination.  Difficulty starting urine stream.  Weak urine stream.  Discharge from the urethra.  Dribbling after urination.  Rectal pain.  Pain in the testicles, penis, or tip of the penis.  Pain in the perineum.  Problems with sexual function.  Painful ejaculation.  Bloody semen. DIAGNOSIS  In order to diagnose prostatitis, your health care provider will ask about your symptoms. One or more urine samples will be taken and tested (urinalysis). If the urinalysis result is negative for bacteria, your health care provider may use a finger to feel your prostate (digital rectal exam). This exam helps your health care provider determine if your prostate is swollen and tender. It will also produce a specimen of semen that can be analyzed. TREATMENT  Treatment for prostatitis depends on the cause. If a bacterial infection is the cause, it can be treated with antibiotic medicine. In cases of chronic bacterial prostatitis, the use of antibiotics for up to 1 month or 6 weeks may be necessary. Your health care provider may instruct you to take sitz baths to help relieve pain. A sitz bath is a bath of hot water in which your hips and buttocks are under water. This relaxes the pelvic floor muscles and often helps to relieve the pressure on your prostate. HOME CARE INSTRUCTIONS   Take all medicines as directed by your health care provider.  Take sitz baths as directed by your health care provider. SEEK MEDICAL CARE IF:   Your symptoms   get worse, not better.  You have a fever. SEEK IMMEDIATE MEDICAL CARE IF:   You have chills.  You feel nauseous or vomit.  You feel lightheaded or faint.  You are unable to urinate.  You have blood or blood clots in your urine. Document Released: 08/30/2000  Document Revised: 06/23/2013 Document Reviewed: 03/22/2013 Exit[employer] Patient Information 2014 [employer].  

## 2013-12-08 LAB — BMP8+EGFR
BUN/Creatinine Ratio: 6 — ABNORMAL LOW (ref 8–19)
BUN: 6 mg/dL (ref 6–20)
CALCIUM: 9.9 mg/dL (ref 8.7–10.2)
CO2: 27 mmol/L (ref 18–29)
Chloride: 99 mmol/L (ref 97–108)
Creatinine, Ser: 1.07 mg/dL (ref 0.76–1.27)
GFR calc Af Amer: 101 mL/min/{1.73_m2} (ref >59)
GFR calc non Af Amer: 88 mL/min/{1.73_m2} (ref >59)
Glucose: 97 mg/dL (ref 65–99)
POTASSIUM: 4.3 mmol/L (ref 3.5–5.2)
Sodium: 142 mmol/L (ref 134–144)

## 2016-10-07 ENCOUNTER — Emergency Department (HOSPITAL_BASED_OUTPATIENT_CLINIC_OR_DEPARTMENT_OTHER)
Admission: EM | Admit: 2016-10-07 | Discharge: 2016-10-07 | Disposition: A | Discharge status: Home / Self Care | Payer: BLUE CROSS/BLUE SHIELD | Attending: Emergency Medicine | Admitting: Emergency Medicine

## 2016-10-07 ENCOUNTER — Emergency Department (HOSPITAL_BASED_OUTPATIENT_CLINIC_OR_DEPARTMENT_OTHER): Payer: BLUE CROSS/BLUE SHIELD

## 2016-10-07 ENCOUNTER — Encounter (HOSPITAL_BASED_OUTPATIENT_CLINIC_OR_DEPARTMENT_OTHER): Payer: Self-pay | Admitting: Emergency Medicine

## 2016-10-07 DIAGNOSIS — N201 Calculus of ureter: Secondary | ICD-10-CM

## 2016-10-07 DIAGNOSIS — F17200 Nicotine dependence, unspecified, uncomplicated: Secondary | ICD-10-CM | POA: Diagnosis not present

## 2016-10-07 DIAGNOSIS — R112 Nausea with vomiting, unspecified: Secondary | ICD-10-CM | POA: Diagnosis not present

## 2016-10-07 DIAGNOSIS — R1032 Left lower quadrant pain: Secondary | ICD-10-CM | POA: Diagnosis present

## 2016-10-07 LAB — CBC
HEMATOCRIT: 43.8 % (ref 39.0–52.0)
HEMOGLOBIN: 15.6 g/dL (ref 13.0–17.0)
MCH: 31.8 pg (ref 26.0–34.0)
MCHC: 35.6 g/dL (ref 30.0–36.0)
MCV: 89.4 fL (ref 78.0–100.0)
Platelets: 252 10*3/uL (ref 150–400)
RBC: 4.9 MIL/uL (ref 4.22–5.81)
RDW: 12.4 % (ref 11.5–15.5)
WBC: 11.1 10*3/uL — AB (ref 4.0–10.5)

## 2016-10-07 LAB — BASIC METABOLIC PANEL
ANION GAP: 9 (ref 5–15)
BUN: 16 mg/dL (ref 6–20)
CALCIUM: 9.5 mg/dL (ref 8.9–10.3)
CO2: 26 mmol/L (ref 22–32)
Chloride: 103 mmol/L (ref 101–111)
Creatinine, Ser: 1.27 mg/dL — ABNORMAL HIGH (ref 0.61–1.24)
Glucose, Bld: 114 mg/dL — ABNORMAL HIGH (ref 65–99)
Potassium: 3.6 mmol/L (ref 3.5–5.1)
Sodium: 138 mmol/L (ref 135–145)

## 2016-10-07 LAB — URINALYSIS, ROUTINE W REFLEX MICROSCOPIC
Bilirubin Urine: NEGATIVE
Glucose, UA: NEGATIVE mg/dL
KETONES UR: NEGATIVE mg/dL
NITRITE: NEGATIVE
PH: 6.5 (ref 5.0–8.0)
Protein, ur: 30 mg/dL — AB
Specific Gravity, Urine: 1.021 (ref 1.005–1.030)

## 2016-10-07 LAB — URINALYSIS, MICROSCOPIC (REFLEX)

## 2016-10-07 MED ORDER — TAMSULOSIN HCL 0.4 MG PO CAPS: 0.4000 mg | ORAL_CAPSULE | Freq: Every day | ORAL | 0 refills | Status: DC

## 2016-10-07 MED ORDER — HYDROMORPHONE HCL 1 MG/ML IJ SOLN: 1.0000 mg | Freq: Once | INTRAMUSCULAR | Status: DC

## 2016-10-07 MED ORDER — HYDROMORPHONE HCL 1 MG/ML IJ SOLN
0.5000 mg | Freq: Once | INTRAMUSCULAR | Status: AC
  Administered 2016-10-07: 0.5 mg via INTRAVENOUS
  Filled 2016-10-07: qty 1

## 2016-10-07 MED ORDER — ONDANSETRON HCL 4 MG/2ML IJ SOLN
4.0000 mg | Freq: Once | INTRAMUSCULAR | Status: AC
  Administered 2016-10-07: 4 mg via INTRAVENOUS
  Filled 2016-10-07: qty 2

## 2016-10-07 MED ORDER — KETOROLAC TROMETHAMINE 30 MG/ML IJ SOLN
30.0000 mg | Freq: Once | INTRAMUSCULAR | Status: AC
  Administered 2016-10-07: 30 mg via INTRAVENOUS
  Filled 2016-10-07: qty 1

## 2016-10-07 MED ORDER — HYDROCODONE-ACETAMINOPHEN 5-325 MG PO TABS
1.0000 | ORAL_TABLET | ORAL | 0 refills | Status: DC | PRN
Start: 2016-10-07 — End: 2019-10-14

## 2016-10-07 MED ORDER — ONDANSETRON 8 MG PO TBDP: 8.0000 mg | ORAL_TABLET | Freq: Three times a day (TID) | ORAL | 0 refills | Status: DC | PRN

## 2016-10-07 MED FILL — HYDROCODON-APAP 5-325: 5-325 | 2 days supply | Qty: 10 | Fill #0

## 2016-10-07 MED FILL — TAMSULOSIN HCL 0.4 MG CAP: 0.4 | 7 days supply | Qty: 7 | Fill #0

## 2016-10-07 MED FILL — ONDANSETRON ODT 8 MG TABLET: 8 | 3 days supply | Qty: 10 | Fill #0

## 2016-10-07 NOTE — ED Notes (Signed)
EDP notified of pain post IV meds. Pt attempting Urine sample.

## 2016-10-07 NOTE — ED Triage Notes (Signed)
Pt presents with LLQ pain with radiation to the left flank beginning this morning. Pt reports going to work and then leaving due to the pain. Also reports a few episodes of nausea and vomiting with pain. Denies diarrhea, urinary issues or chest pains. Pt is very uncomfortable during triage. 10/10

## 2016-10-07 NOTE — ED Notes (Signed)
Patient transported to CT 

## 2016-10-07 NOTE — ED Notes (Signed)
ED Provider at bedside. 

## 2016-10-07 NOTE — ED Triage Notes (Signed)
Reports hx of kidney stones.

## 2016-10-07 NOTE — ED Provider Notes (Signed)
[facility]   CSN: [id number] Arrival date & time: 10/07/16  0849     History   Chief Complaint Chief Complaint  Patient presents with  . Abdominal Pain    HPI Johnny Dean is a 41 y.o. male.  HPI  Patient presents with left lower abdominal pain that began this morning. Some radiation to flank. Pain gets severe at work. Has had some nausea and vomiting. No diarrhea. No dysuria. Has a history of kidney stones and states he thinks this may feel somewhat like that. He is not sure when his last kidney stone was. No hematuria. No fevers or chills.  Past Medical History:  Diagnosis Date  . Kidney stone     There are no active problems to display for this patient.   Past Surgical History:  Procedure Laterality Date  . LITHOTRIPSY    . NASAL SINUS SURGERY    . PERCUTANEOUS PINNING Right 02/27/2013   Procedure: PERCUTANEOUS PINNING EXTREMITY;  Surgeon: Sharma Covert, MD;  Location: [facility] OR;  Service: Orthopedics;  Laterality: Right;       Home Medications    Prior to Admission medications   Medication Sig Start Date End Date Taking? Authorizing Provider  HYDROcodone-acetaminophen (NORCO/VICODIN) 5-325 MG tablet Take 1-2 tablets by mouth every 4 (four) hours as needed. 10/07/16   Benjiman Core, MD  ibuprofen (ADVIL,MOTRIN) 800 MG tablet Take 1 tablet (800 mg total) by mouth every 8 (eight) hours as needed. 08/04/13   Coralie Keens, FNP  ondansetron (ZOFRAN-ODT) 8 MG disintegrating tablet Take 1 tablet (8 mg total) by mouth every 8 (eight) hours as needed for nausea or vomiting. 10/07/16   Benjiman Core, MD  sulfamethoxazole-trimethoprim (BACTRIM DS) 800-160 MG per tablet Take 1 tablet by mouth 2 (two) times daily. 12/07/13   Mary-Margaret Daphine Deutscher, FNP  tamsulosin (FLOMAX) 0.4 MG CAPS capsule Take 1 capsule (0.4 mg total) by mouth daily. 10/07/16   Benjiman Core, MD    Family History Family History  Problem Relation Age of Onset  .  Hypertension Father     Social History Social History  Substance Use Topics  . Smoking status: Never Smoker  . Smokeless tobacco: Current User  . Alcohol use Yes     Comment: 2x/wk     Allergies   Shrimp [shellfish allergy]   Review of Systems Review of Systems  Constitutional: Positive for activity change.       Patient appears uncomfortable  HENT: Negative for congestion.   Respiratory: Negative for shortness of breath.   Cardiovascular: Negative for chest pain.  Gastrointestinal: Positive for abdominal pain, nausea and vomiting.  Endocrine: Negative for polyuria.  Genitourinary: Positive for flank pain. Negative for difficulty urinating, dysuria and enuresis.  Musculoskeletal: Negative for back pain.  Neurological: Negative for weakness.  Hematological: Negative for adenopathy.  Psychiatric/Behavioral: Negative for confusion.     Physical Exam Updated Vital Signs BP 120/57 (BP Location: Left Arm)   Pulse 61   Temp 97.7 F (36.5 C) (Oral)   Resp 18   SpO2 98%   Physical Exam  Constitutional: He appears well-developed.  Patient appears uncomfortable  HENT:  Head: Atraumatic.  Eyes: Conjunctivae are normal.  Neck: Neck supple.  Cardiovascular: Normal rate.   Pulmonary/Chest: Effort normal.  Abdominal: He exhibits no distension and no mass. There is no tenderness. There is no guarding.  Genitourinary:  Genitourinary Comments: No testicular tenderness  Musculoskeletal: He exhibits no edema.  Neurological: He is alert.  Skin: Skin is warm. Capillary refill takes less than 2 seconds.     ED Treatments / Results  Labs (all labs ordered are listed, but only abnormal results are displayed) Labs Reviewed  CBC - Abnormal; Notable for the following:       Result Value   WBC 11.1 (*)    All other components within normal limits  URINALYSIS, ROUTINE W REFLEX MICROSCOPIC - Abnormal; Notable for the following:    Hgb urine dipstick MODERATE (*)    Protein, ur  30 (*)    Leukocytes, UA SMALL (*)    All other components within normal limits  BASIC METABOLIC PANEL - Abnormal; Notable for the following:    Glucose, Bld 114 (*)    Creatinine, Ser 1.27 (*)    All other components within normal limits  URINALYSIS, MICROSCOPIC (REFLEX) - Abnormal; Notable for the following:    Bacteria, UA FEW (*)    Squamous Epithelial / LPF 0-5 (*)    All other components within normal limits    EKG  EKG Interpretation None       Radiology Ct Renal Stone Study  Result Date: 10/07/2016 CLINICAL DATA:  Left flank pain since this morning. Gross hematuria. EXAM: CT ABDOMEN AND PELVIS WITHOUT CONTRAST TECHNIQUE: Multidetector CT imaging of the abdomen and pelvis was performed following the standard protocol without IV contrast. COMPARISON:  CT scan 09/29/2006 FINDINGS: Lower chest: The lung bases are clear of acute process. No pleural effusion or pulmonary lesions. The heart is normal in size. No pericardial effusion. The distal esophagus and aorta are unremarkable. Minimal streaky left basilar atelectasis. Hepatobiliary: No focal hepatic lesions or intrahepatic biliary dilatation. The gallbladder is normal. Normal caliber common bile duct. Pancreas: No mass, inflammation or ductal dilatation. Spleen: Normal size.  No focal lesions. Adrenals/Urinary Tract: The adrenal glands are normal in stable. Numerous small right renal calculi but no obstructing ureteral calculi. No obvious right renal mass. Numerous left-sided renal calculi and marked hydroureteronephrosis down to obstructing calculi in the mid to upper left ureter located at the L3 level. The largest calculus measures 7.5 x 6.5 mm and smaller calculus measures 4 mm. No distal ureteral calculi. No bladder calculi. No bladder mass. Stomach/Bowel: The stomach, duodenum, small bowel and colon are grossly normal without oral contrast. No inflammatory changes, mass lesions or obstructive findings. The terminal ileum and  appendix are normal. Vascular/Lymphatic: No significant vascular findings are present. No enlarged abdominal or pelvic lymph nodes. Reproductive: The prostate gland and seminal vesicles are unremarkable. Other: No ascites or abdominal wall hernia. No inguinal mass or inguinal adenopathy. No inguinal hernia. Musculoskeletal: No significant bony findings.  Stable scoliosis. IMPRESSION: 1. High-grade obstruction of the left kidney by 2 left mid ureteral calculi as discussed above. 2. Bilateral renal calculi. Electronically Signed   By: Rudie MeyerP.  Gallerani M.D.   On: 10/07/2016 11:17    Procedures Procedures (including critical care time)  Medications Ordered in ED Medications  HYDROmorphone (DILAUDID) injection 0.5 mg (0.5 mg Intravenous Given 10/07/16 0924)  ondansetron (ZOFRAN) injection 4 mg (4 mg Intravenous Given 10/07/16 0924)  ketorolac (TORADOL) 30 MG/ML injection 30 mg (30 mg Intravenous Given 10/07/16 1040)     Initial Impression / Assessment and Plan / ED Course  I have reviewed the triage vital signs and the nursing notes.  Pertinent labs & imaging results that were available during my care of the patient were reviewed by me and considered in my medical decision making (see chart for  details).     Flank pain. Mildly elevated creatinine. Relatively large stone. Feels better after Toradol. Will discharge home. Patient has a urologist with [facility] but does not remember who it is.  Final Clinical Impressions(s) / ED Diagnoses   Final diagnoses:  Left ureteral stone    New Prescriptions New Prescriptions   HYDROCODONE-ACETAMINOPHEN (NORCO/VICODIN) 5-325 MG TABLET    Take 1-2 tablets by mouth every 4 (four) hours as needed.   ONDANSETRON (ZOFRAN-ODT) 8 MG DISINTEGRATING TABLET    Take 1 tablet (8 mg total) by mouth every 8 (eight) hours as needed for nausea or vomiting.   TAMSULOSIN (FLOMAX) 0.4 MG CAPS CAPSULE    Take 1 capsule (0.4 mg total) by mouth daily.     Benjiman Core,  MD 10/07/16 1155

## 2016-10-09 ENCOUNTER — Encounter (HOSPITAL_COMMUNITY): Payer: Self-pay | Admitting: *Deleted

## 2016-10-09 ENCOUNTER — Other Ambulatory Visit: Payer: Self-pay | Admitting: Urology

## 2016-10-10 ENCOUNTER — Ambulatory Visit (HOSPITAL_COMMUNITY)
Admission: RE | Admit: 2016-10-10 | Discharge: 2016-10-10 | Disposition: A | Discharge status: Home / Self Care | Payer: BLUE CROSS/BLUE SHIELD | Source: Ambulatory Visit | Attending: Urology | Admitting: Urology

## 2016-10-10 ENCOUNTER — Encounter (HOSPITAL_COMMUNITY)
Admission: RE | Disposition: A | Discharge status: Home / Self Care | Payer: Self-pay | Source: Ambulatory Visit | Attending: Urology

## 2016-10-10 ENCOUNTER — Ambulatory Visit (HOSPITAL_COMMUNITY): Payer: BLUE CROSS/BLUE SHIELD

## 2016-10-10 DIAGNOSIS — N201 Calculus of ureter: Secondary | ICD-10-CM | POA: Insufficient documentation

## 2016-10-10 DIAGNOSIS — Z791 Long term (current) use of non-steroidal anti-inflammatories (NSAID): Secondary | ICD-10-CM | POA: Insufficient documentation

## 2016-10-10 DIAGNOSIS — Z720 Tobacco use: Secondary | ICD-10-CM | POA: Diagnosis not present

## 2016-10-10 DIAGNOSIS — Z9889 Other specified postprocedural states: Secondary | ICD-10-CM | POA: Diagnosis not present

## 2016-10-10 DIAGNOSIS — Z87442 Personal history of urinary calculi: Secondary | ICD-10-CM | POA: Insufficient documentation

## 2016-10-10 DIAGNOSIS — Z91013 Allergy to seafood: Secondary | ICD-10-CM | POA: Insufficient documentation

## 2016-10-10 HISTORY — PX: EXTRACORPOREAL SHOCK WAVE LITHOTRIPSY: SHX1557

## 2016-10-10 HISTORY — DX: Personal history of urinary calculi: Z87.442

## 2016-10-10 SURGERY — LITHOTRIPSY, ESWL
Anesthesia: LOCAL | Laterality: Left

## 2016-10-10 MED ORDER — DIPHENHYDRAMINE HCL 25 MG PO CAPS
25.0000 mg | ORAL_CAPSULE | ORAL | Status: AC
  Administered 2016-10-10: 25 mg via ORAL
  Filled 2016-10-10: qty 1

## 2016-10-10 MED ORDER — CIPROFLOXACIN HCL 500 MG PO TABS
500.0000 mg | ORAL_TABLET | ORAL | Status: AC
  Administered 2016-10-10: 500 mg via ORAL
  Filled 2016-10-10: qty 1

## 2016-10-10 MED ORDER — SODIUM CHLORIDE 0.9 % IV SOLN
INTRAVENOUS | Status: DC
  Administered 2016-10-10: 10:00:00 via INTRAVENOUS

## 2016-10-10 MED ORDER — DIAZEPAM 5 MG PO TABS
10.0000 mg | ORAL_TABLET | ORAL | Status: AC
  Administered 2016-10-10: 10 mg via ORAL
  Filled 2016-10-10: qty 2

## 2016-10-10 NOTE — H&P (Signed)
Johnny Dean is a 41 year-old male patient who is here for further eval and management of kidney stones.  He was diagnosed with a kidney stone on 10/07/2016. The patient presented to [facility] with symptoms of a kidney stone.   His pain started about 10/07/2016. The pain is on the left side.   Abdomen/Pelvic CT: 10/07/2016. The patient underwent CT scan prior to today's appointment.   The patient relates initially having nausea, vomiting, and flank pain. He is currently having irritative voiding symptoms. He denies having flank pain, back pain, groin pain, nausea, vomiting, fever, and chills. He has been taking oxycodone, tamsulosin. He has not caught a stone in his urine strainer since his symptoms began.   He has had ESWL for treatment of his stones in the past. This is not his first kidney stone. He has had 2 stones prior to getting this one.    ALLERGIES: Iodine SOLN   MEDICATIONS: Flomax 0.4 mg capsule, ext release 24 hr  Multivitamin    GU PSH: No GU PSH     PSH Notes: Sinus Surgery, head surgery, kidney stones, eye surgery, wisdom teeth removed  NON-GU PSH: No Non-GU PSH      GU PMH: Personal Hx urinary calculi, Nephrolithiasis - 2014     PMH Notes:  1898-09-16 00:00:00 - Note: Normal Routine History And Physical Adult  NON-GU PMH: No Non-GU PMH   FAMILY HISTORY: 1 Daughter - Daughter 1 son - Son Family Health Status Number - Runs In Family  SOCIAL HISTORY: Marital Status: Unknown Current Smoking Status: Patient has never smoked.  <DIV'  Tobacco Use Assessment Completed:  Used Tobacco in last 30 days?   Does drink.  Drinks 3 caffeinated drinks per day. Patient's occupation Radio broadcast assistant.    Notes: Tobacco Use, Caffeine Use, Occupation:, Alcohol Use, Marital History - Currently Married  REVIEW OF SYSTEMS:    GU Review Male:  Patient denies frequent urination, hard to postpone urination, burning/ pain with urination, get up at night to urinate,  leakage of urine, stream starts and stops, trouble starting your stream, have to strain to urinate , erection problems, and penile pain.   Gastrointestinal (Upper):  Patient reports nausea and vomiting. Patient denies indigestion/ heartburn.   Gastrointestinal (Lower):  Patient denies constipation and diarrhea.   Constitutional:  Patient denies fever, night sweats, weight loss, and fatigue.   Skin:  Patient denies skin rash/ lesion and itching.   Eyes:  Patient denies blurred vision and double vision.   Ears/ Nose/ Throat:  Patient reports sinus problems. Patient denies sore throat.   Hematologic/Lymphatic:  Patient reports swollen glands. Patient denies easy bruising.   Cardiovascular:  Patient denies leg swelling and chest pains.   Respiratory:  Patient denies cough and shortness of breath.   Endocrine:  Patient denies excessive thirst.   Musculoskeletal:  Patient denies back pain and joint pain.   Neurological:  Patient denies headaches and dizziness.   Psychologic:  Patient denies depression and anxiety.   VITAL SIGNS:      10/08/2016 01:23 PM    Weight 220 lb / 99.79 kg    Height 72 in / 182.88 cm    BP 122/75 mmHg    Pulse 71 /min    Temperature 97.8 F / 37 C    BMI 29.8 kg/m    MULTI-SYSTEM PHYSICAL EXAMINATION:     Constitutional: Well-nourished. No physical deformities. Normally developed. Good grooming.    Neck: Neck symmetrical, not swollen.  Normal tracheal position.    Respiratory: No labored breathing, no use of accessory muscles.     Cardiovascular: Normal temperature, normal extremity pulses, no swelling, no varicosities.    Lymphatic: No enlargement of neck, axillae, groin.    Skin: No paleness, no jaundice, no cyanosis. No lesion, no ulcer, no rash.    Neurologic / Psychiatric: Oriented to time, oriented to place, oriented to person. No depression, no anxiety, no agitation.    Gastrointestinal: No mass, no tenderness, no rigidity, non obese abdomen.    Eyes: Normal  conjunctivae. Normal eyelids.    Ears, Nose, Mouth, and Throat: Left ear no scars, no lesions, no masses. Right ear no scars, no lesions, no masses. Nose no scars, no lesions, no masses. Normal hearing. Normal lips.    Musculoskeletal: Normal gait and station of head and neck.          PAST DATA REVIEWED:  Source Of History:  Patient X-Ray Review: C.T. Abdomen/Pelvis: Reviewed Films. The patient has a 6.7 mm stone in the left mid ureter with a smaller 2 mm stone adjacent to it proximally. In addition he has a small nonobstructing stone in the left lower pole.   PROCEDURES:   Urinalysis  Dipstick Dipstick Cont'd Color: Yellow Bilirubin: Neg Appearance: Clear Ketones: Neg Specific Gravity: 1.015 Blood: Neg pH: 6.0 Protein: Neg Glucose: Neg Urobilinogen: 0.2  Nitrites: Neg  Leukocyte Esterase: Neg   ASSESSMENT:    ICD-10 Details 1 GU:  Calculus Ureter - N20.1 Left, The patient has a symptomatic left mid ureteral stone with a small adjacent stone more proximally. The stone measures approximately 800 [place] units and is amenable to shockwave lithotripsy. The patient has had this procedure in the past and is eager to take care of the stone in this manner again.  PLAN:  Document  Letter(s):  Created for Patient: Clinical Summary We discussed management options including medical expulsion therapy, shockwave lithotripsy, and ureteroscopy. Ultimately, the patient has opted for shock wave lithotripsy. I discussed with the patient the procedure in detail as well as the risk and benefits. The patient is aware that she may need additional procedures. She also is aware of the risks of hematoma and pain. We will try to get this patient's scheduled as soon as possible.

## 2016-10-10 NOTE — Op Note (Signed)
See [facility] OP note scanned into chart. Also because of the size, density, location and other factors that cannot be anticipated I feel this will likely be a staged procedure. This fact supersedes any indication in the scanned [facility] operative note to the contrary.  

## 2016-10-10 NOTE — Discharge Instructions (Signed)
See [facility] discharge instructions in chart. ° ° ° °Moderate Conscious Sedation, Adult, Care After °These instructions provide you with information about caring for yourself after your procedure. Your health care provider may also give you more specific instructions. Your treatment has been planned according to current medical practices, but problems sometimes occur. Call your health care provider if you have any problems or questions after your procedure. °What can I expect after the procedure? °After your procedure, it is common: °· To feel sleepy for several hours. °· To feel clumsy and have poor balance for several hours. °· To have poor judgment for several hours. °· To vomit if you eat too soon. °Follow these instructions at home: °For at least 24 hours after the procedure:  ° °· Do not: °¨ Participate in activities where you could fall or become injured. °¨ Drive. °¨ Use heavy machinery. °¨ Drink alcohol. °¨ Take sleeping pills or medicines that cause drowsiness. °¨ Make important decisions or sign legal documents. °¨ Take care of children on your own. °· Rest. °Eating and drinking  °· Follow the diet recommended by your health care provider. °· If you vomit: °¨ Drink water, juice, or soup when you can drink without vomiting. °¨ Make sure you have little or no nausea before eating solid foods. °General instructions  °· Have a responsible adult stay with you until you are awake and alert. °· Take over-the-counter and prescription medicines only as told by your health care provider. °· If you smoke, do not smoke without supervision. °· Keep all follow-up visits as told by your health care provider. This is important. °Contact a health care provider if: °· You keep feeling nauseous or you keep vomiting. °· You feel light-headed. °· You develop a rash. °· You have a fever. °Get help right away if: °· You have trouble breathing. °This information is not intended to replace advice given to you by your  health care provider. Make sure you discuss any questions you have with your health care provider. °Document Released: 06/23/2013 Document Revised: 02/05/2016 Document Reviewed: 12/23/2015 °Elsevier Interactive Patient Education © 2017 [employer] Inc[employer] ° ° °

## 2016-12-10 ENCOUNTER — Encounter (HOSPITAL_COMMUNITY): Payer: Self-pay | Admitting: Urology

## 2017-10-10 IMAGING — CT CT RENAL STONE PROTOCOL
2 of 4 series · 16 of 46 positions shown, 18 images · non-contrast
Comparison: CT scan 09/29/2006

CLINICAL DATA: Left flank pain since this morning. Gross hematuria.

EXAM:
CT ABDOMEN AND PELVIS WITHOUT CONTRAST
TECHNIQUE: Multidetector CT imaging of the abdomen and pelvis was performed
following the standard protocol without IV contrast.

[Series 2: axial st · axial · 0.96mm/px · z∈[-531,-76]mm · 13 of 101 slices shown, 15 images]
[im 5/101  soft-tissue]
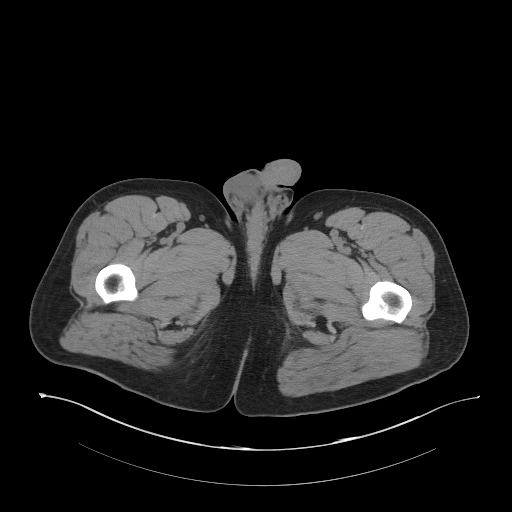
[im 5/101  bone]
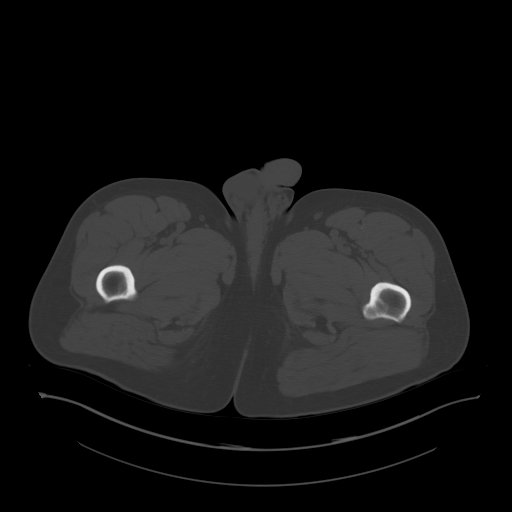
[im 13/101  soft-tissue]
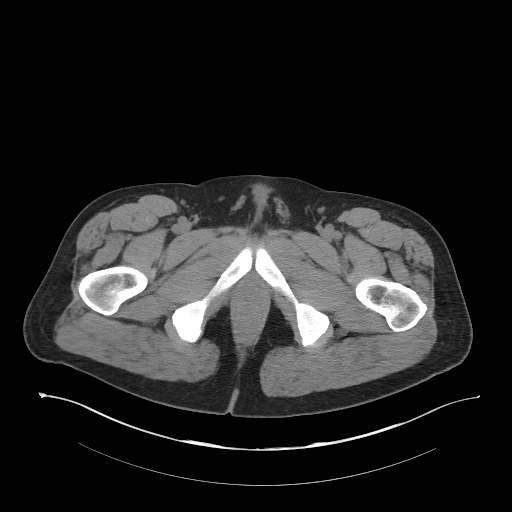
[im 21/101  soft-tissue]
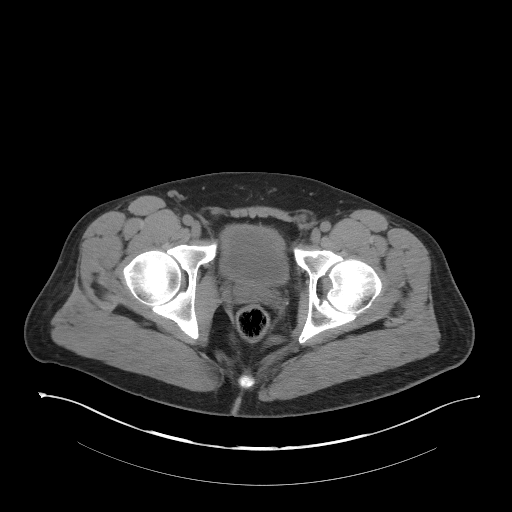
[im 30/101  soft-tissue]
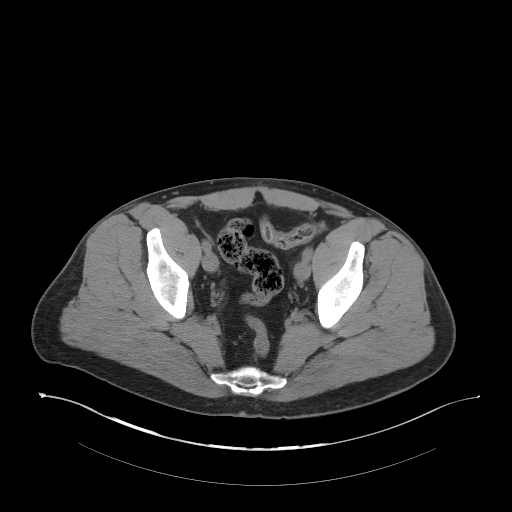
[im 34/101  soft-tissue]
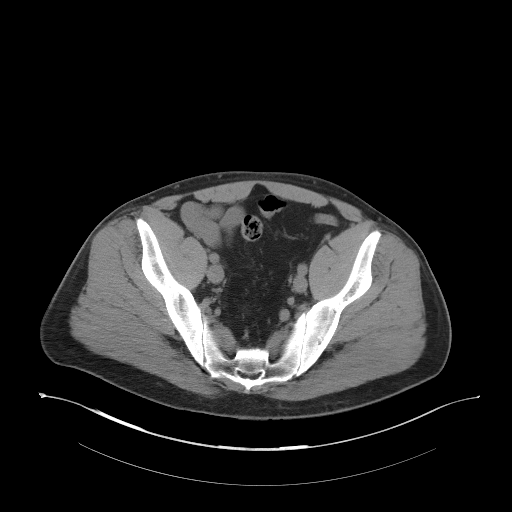
[im 42/101  soft-tissue]
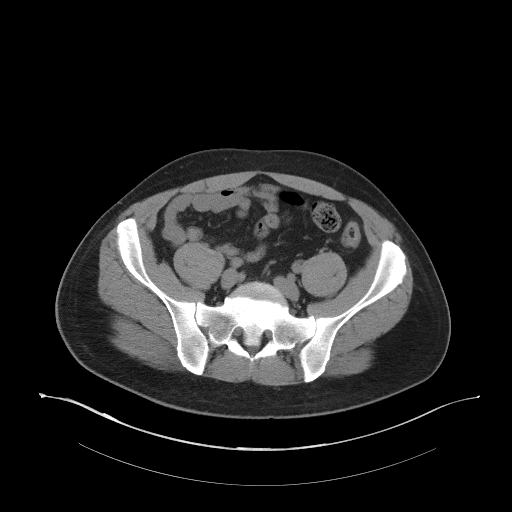
[im 51/101  soft-tissue]
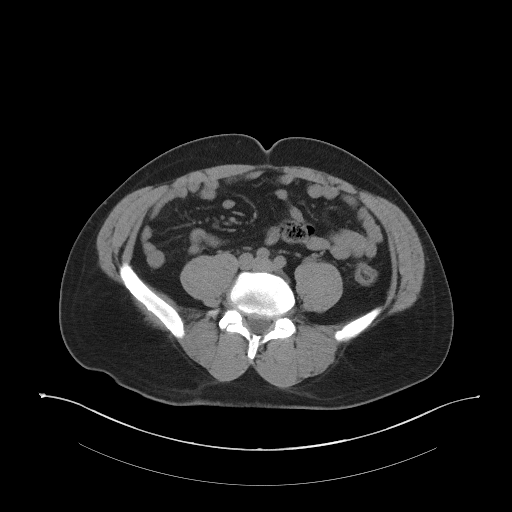
[im 59/101  soft-tissue]
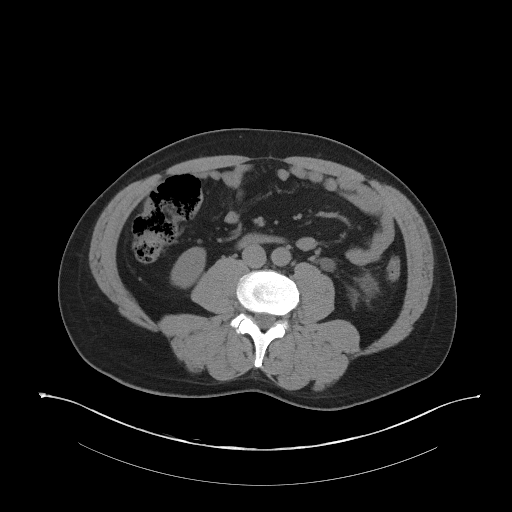
[im 67/101  soft-tissue]
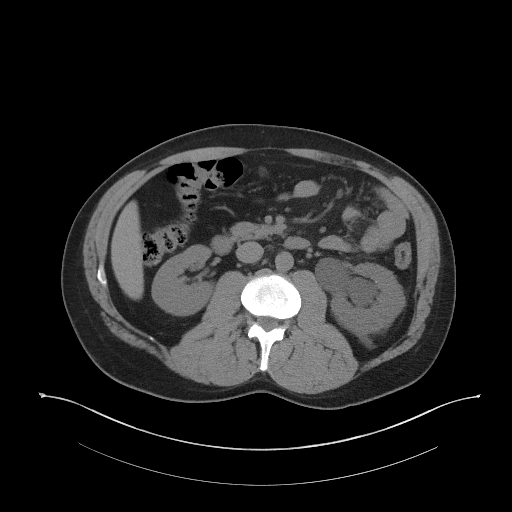
[im 67/101  bone]
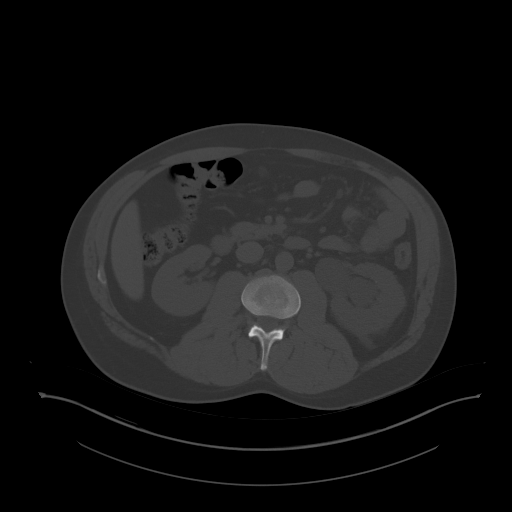
[im 71/101  soft-tissue]
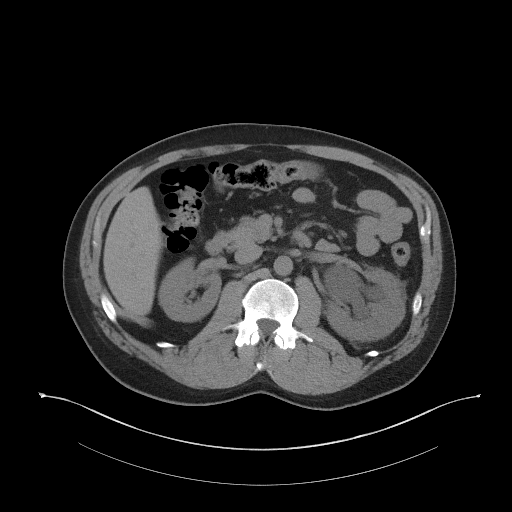
[im 80/101  soft-tissue]
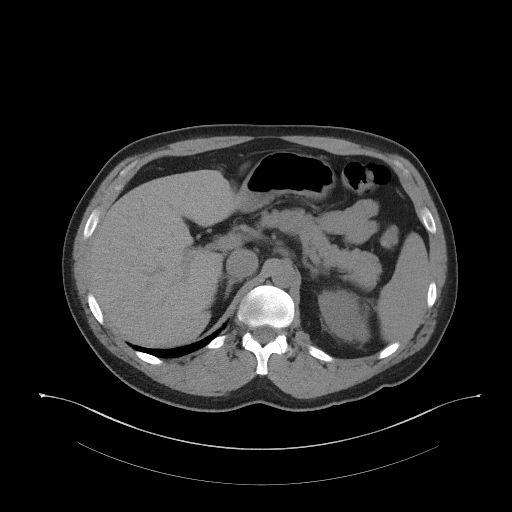
[im 88/101  soft-tissue]
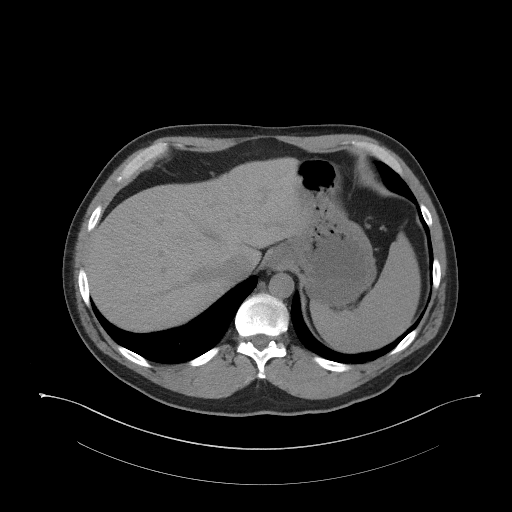
[im 96/101  soft-tissue]
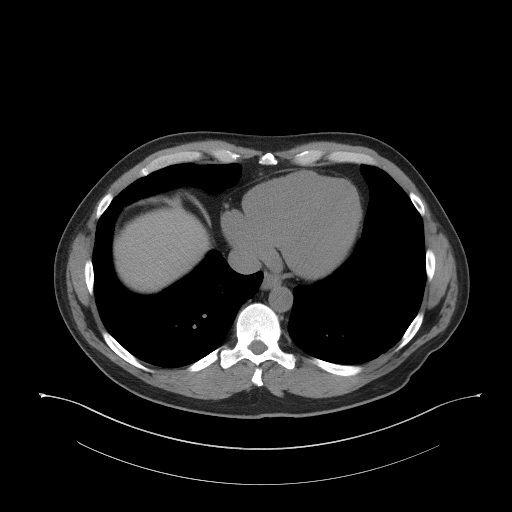

[Series 5: coronal st · coronal · 0.77mm/px · 3 of 98 slices shown]
[im 33/98  soft-tissue]
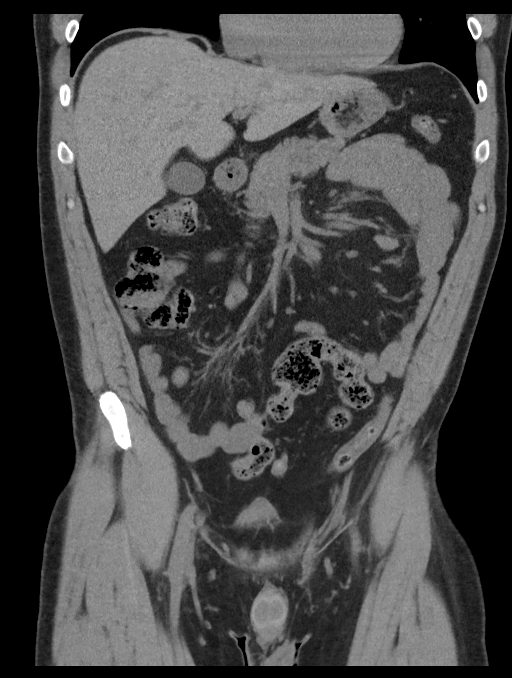
[im 44/98  soft-tissue]
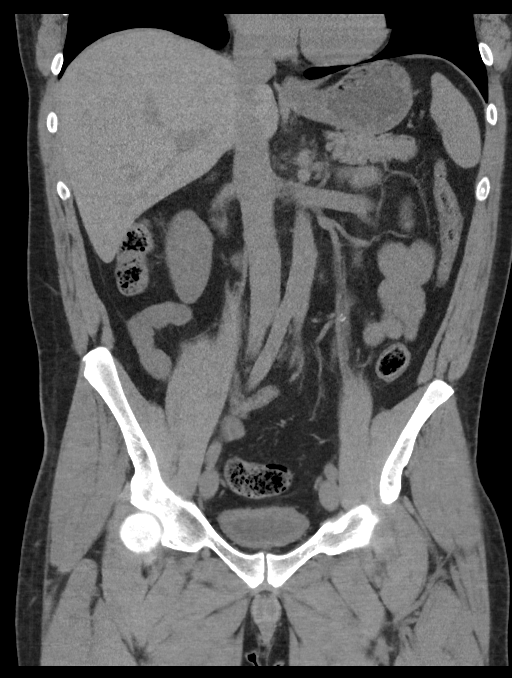
[im 54/98  soft-tissue]
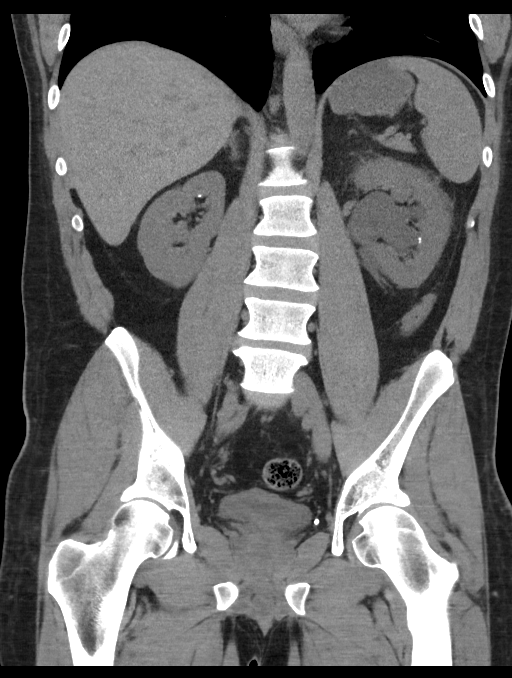

[16 of 46 positions shown; findings below may reference images not displayed]

FINDINGS: Lower chest: The lung bases are clear of acute process. No pleural
effusion or pulmonary lesions. The heart is normal in size. No
pericardial effusion. The distal esophagus and aorta are
unremarkable. Minimal streaky left basilar atelectasis.

Hepatobiliary: No focal hepatic lesions or intrahepatic biliary
dilatation. The gallbladder is normal. Normal caliber common bile
duct.

Pancreas: No mass, inflammation or ductal dilatation.

Spleen: Normal size.  No focal lesions.

Adrenals/Urinary Tract: The adrenal glands are normal in stable.

Numerous small right renal calculi but no obstructing ureteral
calculi. No obvious right renal mass.

Numerous left-sided renal calculi and marked hydroureteronephrosis
down to obstructing calculi in the mid to upper left ureter located
at the L3 level. The largest calculus measures 7.5 x 6.5 mm and
smaller calculus measures 4 mm. No distal ureteral calculi. No
bladder calculi. No bladder mass.

Stomach/Bowel: The stomach, duodenum, small bowel and colon are
grossly normal without oral contrast. No inflammatory changes, mass
lesions or obstructive findings. The terminal ileum and appendix are
normal.

Vascular/Lymphatic: No significant vascular findings are present. No
enlarged abdominal or pelvic lymph nodes.

Reproductive: The prostate gland and seminal vesicles are
unremarkable.

Other: No ascites or abdominal wall hernia. No inguinal mass or
inguinal adenopathy. No inguinal hernia.

Musculoskeletal: No significant bony findings.  Stable scoliosis.
IMPRESSION: 1. High-grade obstruction of the left kidney by 2 left mid ureteral
calculi as discussed above.
2. Bilateral renal calculi.

## 2019-10-13 ENCOUNTER — Other Ambulatory Visit: Payer: Self-pay

## 2019-10-14 ENCOUNTER — Ambulatory Visit: Payer: BC Managed Care – PPO | Admitting: Internal Medicine

## 2019-10-14 ENCOUNTER — Encounter: Payer: Self-pay | Admitting: Internal Medicine

## 2019-10-14 VITALS — BP 120/80 | HR 71 | Temp 97.9°F | Ht 72.0 in | Wt 226.7 lb

## 2019-10-14 DIAGNOSIS — E669 Obesity, unspecified: Secondary | ICD-10-CM

## 2019-10-14 DIAGNOSIS — M2141 Flat foot [pes planus] (acquired), right foot: Secondary | ICD-10-CM

## 2019-10-14 DIAGNOSIS — M2142 Flat foot [pes planus] (acquired), left foot: Secondary | ICD-10-CM | POA: Diagnosis not present

## 2019-10-14 DIAGNOSIS — H538 Other visual disturbances: Secondary | ICD-10-CM | POA: Diagnosis not present

## 2019-10-14 NOTE — Patient Instructions (Signed)
-  Nice seeing you today!!  -Referrals to eye doctor ans sports medicine today.  -Schedule follow up for your physical in 8-12 weeks. Please come in fasting that day.

## 2019-10-14 NOTE — Progress Notes (Signed)
New Patient Office Visit     This visit occurred during the SARS-CoV-2 public health emergency.  Safety protocols were in place, including screening questions prior to the visit, additional usage of staff PPE, and extensive cleaning of exam room while observing appropriate contact time as indicated for disinfecting solutions.    CC/Reason for Visit: Establish care, discuss some acute concerns Previous PCP: None Last Visit: Unknown  HPI: Johnny Dean is a 44 y.o. male who is coming in today for the above mentioned reasons.  He has no past medical history of significance.  He is here today to establish care and to discuss some acute issues.  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  He has no known drug allergies, does not smoke, drinks beer maybe twice a week.  He only takes a multivitamin daily.  He did have lithotripsy for nephrolithiasis about 3 years ago.  He thinks somebody in his family has a history of glaucoma.  He is concerned because he feels like he is losing vision.  He also has very flat feet and has significant pain around his ankles knees.  Past Medical/Surgical History: Past Medical History:  Diagnosis Date  . History of kidney stones     Past Surgical History:  Procedure Laterality Date  . EXTRACORPOREAL SHOCK WAVE LITHOTRIPSY Left 10/10/2016   Procedure: EXTRACORPOREAL SHOCK WAVE LITHOTRIPSY (ESWL);  Surgeon: Ardis Hughs, MD;  Location: [facility];  Service: Urology;  Laterality: Left;  . LITHOTRIPSY    . NASAL SINUS SURGERY    . PERCUTANEOUS PINNING Right 02/27/2013   Procedure: PERCUTANEOUS PINNING EXTREMITY;  Surgeon: Linna Hoff, MD;  Location: [facility];  Service: Orthopedics;  Laterality: Right;    Social History:  reports that he has never smoked. He uses smokeless tobacco. He reports current alcohol use. He reports that he does not use drugs.  Allergies: Allergies  Allergen  Reactions  . Shrimp [Shellfish Allergy]     Has been so long not sure of reaction. Will not touch it now.    Family History:  Family History  Problem Relation Age of Onset  . Hypertension Father      Current Outpatient Medications:  .  ibuprofen (ADVIL,MOTRIN) 800 MG tablet, Take 1 tablet (800 mg total) by mouth every 8 (eight) hours as needed., Disp: 30 tablet, Rfl: 0 .  Multiple Vitamin (MULTIVITAMIN WITH MINERALS) TABS tablet, Take 1 tablet by mouth daily., Disp: , Rfl:  .  naproxen sodium (ALEVE) 220 MG tablet, Take 220 mg by mouth., Disp: , Rfl:   Review of Systems:  Constitutional: Denies fever, chills, diaphoresis, appetite change and fatigue.  HEENT: Denies photophobia, eye pain, redness, hearing loss, ear pain, congestion, sore throat, rhinorrhea, sneezing, mouth sores, trouble swallowing, neck pain, neck stiffness and tinnitus.   Respiratory: Denies SOB, DOE, cough, chest tightness,  and wheezing.   Cardiovascular: Denies chest pain, palpitations and leg swelling.  Gastrointestinal: Denies nausea, vomiting, abdominal pain, diarrhea, constipation, blood in stool and abdominal distention.  Genitourinary: Denies dysuria, urgency, frequency, hematuria, flank pain and difficulty urinating.  Endocrine: Denies: hot or cold intolerance, sweats, changes in hair or nails, polyuria, polydipsia. Musculoskeletal: Denies myalgias, back pain, joint swelling, arthralgias and gait problem.  Skin: Denies pallor, rash and wound.  Neurological: Denies dizziness, seizures, syncope, weakness, light-headedness, numbness and headaches.  Hematological: Denies adenopathy. Easy bruising, personal or family bleeding history  Psychiatric/Behavioral: Denies suicidal ideation, mood changes, confusion, nervousness, sleep disturbance and agitation    Physical Exam: Vitals:   10/14/19 1424  BP: 120/80  Pulse: 71  Temp: 97.9 F (36.6 C)  TempSrc: Temporal  SpO2: 97%  Weight: 226 lb 11.2 oz (102.8  kg)  Height: 6' (1.829 m)   Body mass index is 30.75 kg/m.  Constitutional: NAD, calm, comfortable Eyes: PERRL, lids and conjunctivae normal ENMT: Mucous membranes are moist. Respiratory: clear to auscultation bilaterally, no wheezing, no crackles. Normal respiratory effort. No accessory muscle use.  Cardiovascular: Regular rate and rhythm, no murmurs / rubs / gallops. No extremity edema. .  Neurologic: Grossly intact and nonfocal Psychiatric: Normal judgment and insight. Alert and oriented x 3. Normal mood.    Impression and Plan:  Blurry vision, bilateral/family history of glaucoma - Plan: Ambulatory referral to Ophthalmology  Flat feet  - Plan: Ambulatory referral to Sports Medicine to consider custom orthotics  Obesity (BMI 30.0-34.9) -Discussed healthy lifestyle, including increased physical activity and better food choices to promote weight loss.    Patient Instructions  -Nice seeing you today!!  -Referrals to eye doctor ans sports medicine today.  -Schedule follow up for your physical in 8-12 weeks. Please come in fasting that day.     Chaya Jan, MD [facility]

## 2019-10-27 ENCOUNTER — Encounter: Payer: Self-pay | Admitting: Family Medicine

## 2019-10-27 ENCOUNTER — Other Ambulatory Visit: Payer: Self-pay

## 2019-10-27 ENCOUNTER — Ambulatory Visit: Payer: BC Managed Care – PPO | Admitting: Family Medicine

## 2019-10-27 VITALS — BP 120/76 | HR 88 | Ht 72.0 in | Wt 229.2 lb

## 2019-10-27 DIAGNOSIS — M2141 Flat foot [pes planus] (acquired), right foot: Secondary | ICD-10-CM

## 2019-10-27 DIAGNOSIS — M79672 Pain in left foot: Secondary | ICD-10-CM

## 2019-10-27 DIAGNOSIS — M216X1 Other acquired deformities of right foot: Secondary | ICD-10-CM | POA: Diagnosis not present

## 2019-10-27 DIAGNOSIS — M79671 Pain in right foot: Secondary | ICD-10-CM | POA: Diagnosis not present

## 2019-10-27 DIAGNOSIS — M2142 Flat foot [pes planus] (acquired), left foot: Secondary | ICD-10-CM

## 2019-10-27 DIAGNOSIS — M216X2 Other acquired deformities of left foot: Secondary | ICD-10-CM

## 2019-10-27 NOTE — Progress Notes (Signed)
   Subjective:    I'm seeing this patient as a consultation for:  Dr. Philip Aspen. Note will be routed back to referring provider/PCP.  CC: B foot pain / flat feet  I, Molly Weber, LAT, ATC, am serving as scribe for Dr. Clementeen Graham.  HPI: Pt is a 44 y/o male presenting w/ c/o B foot pain x years.  Pt rates his pain at a 5-6/10 and describes his pain as sharp w/ prolonged weight bearing activity.  Pt states that he notices increased fatigue in his calves and feels decreased stability in his B ankles which may/may not be related to his foot pain.  Pt notes B flat feet.  Radiating pain: No Numbness/tingling: No Aggravating factors: Prolonged standing and prolonged weight bearing Treatments tried: Dr. 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  Past medical history, Surgical history, Family history, Social history, Allergies, and medications have been entered into the medical record, reviewed.   Review of Systems: No new headache, visual changes, nausea, vomiting, diarrhea, constipation, dizziness, abdominal pain, skin rash, fevers, chills, night sweats, weight loss, swollen lymph nodes, body aches, joint swelling, muscle aches, chest pain, shortness of breath, mood changes, visual or auditory hallucinations.   Objective:    Vitals:   10/27/19 1505  BP: 120/76  Pulse: 88  SpO2: 97%   General: Well Developed, well nourished, and in no acute distress.  Neuro/Psych: Alert and oriented x3, extra-ocular muscles intact, able to move all 4 extremities, sensation grossly intact. Skin: Warm and dry, no rashes noted.  Respiratory: Not using accessory muscles, speaking in full sentences, trachea midline.  Cardiovascular: Pulses palpable, no extremity edema. Abdomen: Does not appear distended. MSK:  Right foot: Significant pes planus and ankle pronation.  Mild medial ankle subluxing. Normal foot and ankle motion. Mildly tender  palpation plantar foot from calcaneus to midfoot. Pulses cap refill and sensation are intact distally.  Intact strength.  Normal heel valgus with toe standing.  Left foot: Again significant pes planus and ankle pronation.  Mild medial ankle subluxation. Normal foot ankle motion. Mildly tender palpation plantar foot. Pulses cap refill and sensation intact distally.  Intact strength.  Normal heel valgus with toe standing.   Impression and Recommendations:    Assessment and Plan: 44 y.o. male with  Bilateral foot pain.  Patient has significant pes planus with significant degree of ankle pronation.  Pain functionally plantar fasciitis and some posterior tibialis tendinitis.   Discussed treatment plan and options.. Plan for scaphoid pads and recommend super feet insoles.  Additionally recommend eccentric exercises for plantar fasciitis and posterior tibialis tendinitis.  Also recommend ice massage in the evening and diclofenac gel.  Recheck back in about a month.  If not better would consider x-ray, potential custom orthotics, and even injection.   Discussed warning signs or symptoms. Please see discharge instructions. Patient expresses understanding.   The above documentation has been reviewed and is accurate and complete Clementeen Graham

## 2019-10-27 NOTE — Patient Instructions (Signed)
Thank you for coming in today. Use the scaphoid pads. Medium from [employer].  The exercises.  Go from up to down slowly  30 reps 2-3x daily Straight and pigeon toe.  Try the superfeet insoles with arch support.  Also try using voltaren gel over the counter on your feet.   Ice massage in the evening will help.   Recheck in about 1 month.  Return sooner if needed.  Let me know if not going well.

## 2019-10-28 ENCOUNTER — Ambulatory Visit: Payer: BC Managed Care – PPO | Admitting: Family Medicine

## 2019-11-08 DIAGNOSIS — H524 Presbyopia: Secondary | ICD-10-CM | POA: Diagnosis not present

## 2019-11-21 DIAGNOSIS — Z23 Encounter for immunization: Secondary | ICD-10-CM | POA: Diagnosis not present

## 2019-11-22 ENCOUNTER — Other Ambulatory Visit: Payer: Self-pay

## 2019-11-22 ENCOUNTER — Ambulatory Visit: Payer: BC Managed Care – PPO | Admitting: Family Medicine

## 2019-11-22 ENCOUNTER — Encounter: Payer: Self-pay | Admitting: Family Medicine

## 2019-11-22 VITALS — BP 122/80 | HR 91 | Ht 72.0 in | Wt 225.0 lb

## 2019-11-22 DIAGNOSIS — M2141 Flat foot [pes planus] (acquired), right foot: Secondary | ICD-10-CM

## 2019-11-22 DIAGNOSIS — M79671 Pain in right foot: Secondary | ICD-10-CM

## 2019-11-22 DIAGNOSIS — M2142 Flat foot [pes planus] (acquired), left foot: Secondary | ICD-10-CM

## 2019-11-22 DIAGNOSIS — M79672 Pain in left foot: Secondary | ICD-10-CM

## 2019-11-22 NOTE — Progress Notes (Signed)
   I, [name], LAT, ATC, am serving as scribe for Dr. Clementeen Graham.  Johnny Dean is a 44 y.o. male who presents to [facility] today for f/u of B foot pain associated w/ B pes planus.  He was last seen by Dr. Denyse Amass on 10/27/19 and was provided w/ scaphoid pads and advised to get SuperFeet insoles.  He was soon eccentric exercises for his Achille's.  Since his last visit, pt reports  He is feeling a lot better the insoles and the exercises have helped a lot.    Pertinent review of systems: No fevers or chills  Relevant historical information: No significant medical history   Exam:  BP 122/80 (BP Location: Left Arm, Patient Position: Sitting, Cuff Size: Normal)   Pulse 91   Ht 6' (1.829 m)   Wt 225 lb (102.1 kg)   SpO2 97%   BMI 30.52 kg/m  General: Well Developed, well nourished, and in no acute distress.   MSK: Feet: Normal gait normal ankle motion.     Assessment and Plan: 44 y.o. male with significant improvement plantar foot pain with appropriate footwear, after marked insoles and scaphoid pads.  Additionally patient benefited from home exercise program.  Plan to continue current regimen and recheck back as needed.  Happy to see patient back anytime.   Total encounter time 20 minutes including charting time date of service.  Discussed next steps and management strategies for future pain.   Discussed warning signs or symptoms. Please see discharge instructions. Patient expresses understanding.   The above documentation has been reviewed and is accurate and complete Clementeen Graham

## 2019-11-22 NOTE — Patient Instructions (Signed)
Thank you for coming in today. Continue the superfeet insoles and hapad scaphoid pads.  Keep up the exercises.  Recheck with me as needed.

## 2019-12-12 DIAGNOSIS — Z23 Encounter for immunization: Secondary | ICD-10-CM | POA: Diagnosis not present

## 2020-01-12 ENCOUNTER — Other Ambulatory Visit: Payer: Self-pay

## 2020-01-13 ENCOUNTER — Ambulatory Visit (INDEPENDENT_AMBULATORY_CARE_PROVIDER_SITE_OTHER): Payer: BC Managed Care – PPO | Admitting: Internal Medicine

## 2020-01-13 ENCOUNTER — Encounter: Payer: Self-pay | Admitting: Internal Medicine

## 2020-01-13 VITALS — BP 110/70 | HR 70 | Temp 97.4°F | Ht 72.0 in | Wt 221.1 lb

## 2020-01-13 DIAGNOSIS — Z125 Encounter for screening for malignant neoplasm of prostate: Secondary | ICD-10-CM

## 2020-01-13 DIAGNOSIS — Z0000 Encounter for general adult medical examination without abnormal findings: Secondary | ICD-10-CM

## 2020-01-13 NOTE — Progress Notes (Addendum)
Established Patient Office Visit     This visit occurred during the SARS-CoV-2 public health emergency.  Safety protocols were in place, including screening questions prior to the visit, additional usage of staff PPE, and extensive cleaning of exam room while observing appropriate contact time as indicated for disinfecting solutions.    CC/Reason for Visit: Annual preventive exam  HPI: Johnny Dean is a 43 y.o. male who is coming in today for the above mentioned reasons.  He has no past medical history of significance.  He has been feeling well.  He has received both of his Covid vaccines.  He has routine eye and dental care.  Past Medical/Surgical History: Past Medical History:  Diagnosis Date  . History of kidney stones     Past Surgical History:  Procedure Laterality Date  . EXTRACORPOREAL SHOCK WAVE LITHOTRIPSY Left 10/10/2016   Procedure: EXTRACORPOREAL SHOCK WAVE LITHOTRIPSY (ESWL);  Surgeon: Ardis Hughs, MD;  Location: [facility];  Service: Urology;  Laterality: Left;  . LITHOTRIPSY    . NASAL SINUS SURGERY    . PERCUTANEOUS PINNING Right 02/27/2013   Procedure: PERCUTANEOUS PINNING EXTREMITY;  Surgeon: Linna Hoff, MD;  Location: [facility];  Service: Orthopedics;  Laterality: Right;    Social History:  reports that he has never smoked. He uses smokeless tobacco. He reports current alcohol use. He reports that he does not use drugs.  Allergies: Allergies  Allergen Reactions  . Shrimp [Shellfish Allergy]     Has been so long not sure of reaction. Will not touch it now.    Family History:  Family History  Problem Relation Age of Onset  . Hypertension Father      Current Outpatient Medications:  Marland Kitchen  Multiple Vitamin (MULTIVITAMIN WITH MINERALS) TABS tablet, Take 1 tablet by mouth daily., Disp: , Rfl:   Review of Systems:  Constitutional: Denies fever, chills, diaphoresis, appetite change and fatigue.  HEENT: Denies photophobia, eye pain, redness,  hearing loss, ear pain, congestion, sore throat, rhinorrhea, sneezing, mouth sores, trouble swallowing, neck pain, neck stiffness and tinnitus.   Respiratory: Denies SOB, DOE, cough, chest tightness,  and wheezing.   Cardiovascular: Denies chest pain, palpitations and leg swelling.  Gastrointestinal: Denies nausea, vomiting, abdominal pain, diarrhea, constipation, blood in stool and abdominal distention.  Genitourinary: Denies dysuria, urgency, frequency, hematuria, flank pain and difficulty urinating.  Endocrine: Denies: hot or cold intolerance, sweats, changes in hair or nails, polyuria, polydipsia. Musculoskeletal: Denies myalgias, back pain, joint swelling, arthralgias and gait problem.  Skin: Denies pallor, rash and wound.  Neurological: Denies dizziness, seizures, syncope, weakness, light-headedness, numbness and headaches.  Hematological: Denies adenopathy. Easy bruising, personal or family bleeding history  Psychiatric/Behavioral: Denies suicidal ideation, mood changes, confusion, nervousness, sleep disturbance and agitation    Physical Exam: Vitals:   01/13/20 1453  BP: 110/70  Pulse: 70  Temp: (!) 97.4 F (36.3 C)  TempSrc: Temporal  SpO2: 96%  Weight: 221 lb 1.6 oz (100.3 kg)  Height: 6' (1.829 m)    Body mass index is 29.99 kg/m.   Constitutional: NAD, calm, comfortable Eyes: PERRL, lids and conjunctivae normal ENMT: Mucous membranes are moist.Tympanic membrane is obstructed by cerumen bilaterally. Neck: normal, supple, no masses, no thyromegaly Respiratory: clear to auscultation bilaterally, no wheezing, no crackles. Normal respiratory effort. No accessory muscle use.  Cardiovascular: Regular rate and rhythm, no murmurs / rubs / gallops. No extremity edema. 2+ pedal pulses. No carotid bruits.  Abdomen: no tenderness, no masses palpated.  No hepatosplenomegaly. Bowel sounds positive.  Musculoskeletal: no clubbing / cyanosis. No joint deformity upper and lower  extremities. Good ROM, no contractures. Normal muscle tone.  Skin: no rashes, lesions, ulcers. No induration Neurologic: Grossly intact and nonfocal Psychiatric: Normal judgment and insight. Alert and oriented x 3. Normal mood.    Impression and Plan:  Encounter for preventive health examination  -He has routine eye and dental care. -All vaccinations are up-to-date including Covid x2. -Screening labs today. -Healthy lifestyle discussed in detail. -Check PSA for prostate cancer screening. -Referral for screening colonoscopy today.    Patient Instructions  -Nice seeing you today!!  -Lab work today; will notify you once results are available.  -See you back in 1 year or sooner as needed.   Preventive Care 8-81 Years Old, Male Preventive care refers to lifestyle choices and visits with your health care provider that can promote health and wellness. This includes:  A yearly physical exam. This is also called an annual well check.  Regular dental and eye exams.  Immunizations.  Screening for certain conditions.  Healthy lifestyle choices, such as eating a healthy diet, getting regular exercise, not using drugs or products that contain nicotine and tobacco, and limiting alcohol use. What can I expect for my preventive care visit? Physical exam Your health care provider will check:  Height and weight. These may be used to calculate body mass index (BMI), which is a measurement that tells if you are at a healthy weight.  Heart rate and blood pressure.  Your skin for abnormal spots. Counseling Your health care provider may ask you questions about:  Alcohol, tobacco, and drug use.  Emotional well-being.  Home and relationship well-being.  Sexual activity.  Eating habits.  Work and work Statistician. What immunizations do I need?  Influenza (flu) vaccine  This is recommended every year. Tetanus, diphtheria, and pertussis (Tdap) vaccine  You may need a Td  booster every 10 years. Varicella (chickenpox) vaccine  You may need this vaccine if you have not already been vaccinated. Zoster (shingles) vaccine  You may need this after age 55. Measles, mumps, and rubella (MMR) vaccine  You may need at least one dose of MMR if you were born in 1957 or later. You may also need a second dose. Pneumococcal conjugate (PCV13) vaccine  You may need this if you have certain conditions and were not previously vaccinated. Pneumococcal polysaccharide (PPSV23) vaccine  You may need one or two doses if you smoke cigarettes or if you have certain conditions. Meningococcal conjugate (MenACWY) vaccine  You may need this if you have certain conditions. Hepatitis A vaccine  You may need this if you have certain conditions or if you travel or work in places where you may be exposed to hepatitis A. Hepatitis B vaccine  You may need this if you have certain conditions or if you travel or work in places where you may be exposed to hepatitis B. Haemophilus influenzae type b (Hib) vaccine  You may need this if you have certain risk factors. Human papillomavirus (HPV) vaccine  If recommended by your health care provider, you may need three doses over 6 months. You may receive vaccines as individual doses or as more than one vaccine together in one shot (combination vaccines). Talk with your health care provider about the risks and benefits of combination vaccines. What tests do I need? Blood tests  Lipid and cholesterol levels. These may be checked every 5 years, or more frequently if you are  over 57 years old.  Hepatitis C test.  Hepatitis B test. Screening  Lung cancer screening. You may have this screening every year starting at age 79 if you have a 30-pack-year history of smoking and currently smoke or have quit within the past 15 years.  Prostate cancer screening. Recommendations will vary depending on your family history and other risks.  Colorectal  cancer screening. All adults should have this screening starting at age 67 and continuing until age 19. Your health care provider may recommend screening at age 41 if you are at increased risk. You will have tests every 1-10 years, depending on your results and the type of screening test.  Diabetes screening. This is done by checking your blood sugar (glucose) after you have not eaten for a while (fasting). You may have this done every 1-3 years.  Sexually transmitted disease (STD) testing. Follow these instructions at home: Eating and drinking  Eat a diet that includes fresh fruits and vegetables, whole grains, lean protein, and low-fat dairy products.  Take vitamin and mineral supplements as recommended by your health care provider.  Do not drink alcohol if your health care provider tells you not to drink.  If you drink alcohol: ? Limit how much you have to 0-2 drinks a day. ? Be aware of how much alcohol is in your drink. In the [place] one drink equals one 12 oz bottle of beer (355 mL), one 5 oz glass of wine (148 mL), or one 1 oz glass of hard liquor (44 mL). Lifestyle  Take daily care of your teeth and gums.  Stay active. Exercise for at least 30 minutes on 5 or more days each week.  Do not use any products that contain nicotine or tobacco, such as cigarettes, e-cigarettes, and chewing tobacco. If you need help quitting, ask your health care provider.  If you are sexually active, practice safe sex. Use a condom or other form of protection to prevent STIs (sexually transmitted infections).  Talk with your health care provider about taking a low-dose aspirin every day starting at age 60. What's next?  Go to your health care provider once a year for a well check visit.  Ask your health care provider how often you should have your eyes and teeth checked.  Stay up to date on all vaccines. This information is not intended to replace advice given to you by your health care provider.  Make sure you discuss any questions you have with your health care provider. Document Revised: 08/27/2018 Document Reviewed: 08/27/2018 Elsevier Patient Education  2020 [place], [place] [facility]

## 2020-01-13 NOTE — Patient Instructions (Signed)
-Nice seeing you today!!  -Lab work today; will notify you once results are available.  -See you back in 1 year or sooner as needed.   Preventive Care 48-45 Years Old, Male Preventive care refers to lifestyle choices and visits with your health care provider that can promote health and wellness. This includes:  A yearly physical exam. This is also called an annual well check.  Regular dental and eye exams.  Immunizations.  Screening for certain conditions.  Healthy lifestyle choices, such as eating a healthy diet, getting regular exercise, not using drugs or products that contain nicotine and tobacco, and limiting alcohol use. What can I expect for my preventive care visit? Physical exam Your health care provider will check:  Height and weight. These may be used to calculate body mass index (BMI), which is a measurement that tells if you are at a healthy weight.  Heart rate and blood pressure.  Your skin for abnormal spots. Counseling Your health care provider may ask you questions about:  Alcohol, tobacco, and drug use.  Emotional well-being.  Home and relationship well-being.  Sexual activity.  Eating habits.  Work and work Statistician. What immunizations do I need?  Influenza (flu) vaccine  This is recommended every year. Tetanus, diphtheria, and pertussis (Tdap) vaccine  You may need a Td booster every 10 years. Varicella (chickenpox) vaccine  You may need this vaccine if you have not already been vaccinated. Zoster (shingles) vaccine  You may need this after age 89. Measles, mumps, and rubella (MMR) vaccine  You may need at least one dose of MMR if you were born in 1957 or later. You may also need a second dose. Pneumococcal conjugate (PCV13) vaccine  You may need this if you have certain conditions and were not previously vaccinated. Pneumococcal polysaccharide (PPSV23) vaccine  You may need one or two doses if you smoke cigarettes or if you have  certain conditions. Meningococcal conjugate (MenACWY) vaccine  You may need this if you have certain conditions. Hepatitis A vaccine  You may need this if you have certain conditions or if you travel or work in places where you may be exposed to hepatitis A. Hepatitis B vaccine  You may need this if you have certain conditions or if you travel or work in places where you may be exposed to hepatitis B. Haemophilus influenzae type b (Hib) vaccine  You may need this if you have certain risk factors. Human papillomavirus (HPV) vaccine  If recommended by your health care provider, you may need three doses over 6 months. You may receive vaccines as individual doses or as more than one vaccine together in one shot (combination vaccines). Talk with your health care provider about the risks and benefits of combination vaccines. What tests do I need? Blood tests  Lipid and cholesterol levels. These may be checked every 5 years, or more frequently if you are over 48 years old.  Hepatitis C test.  Hepatitis B test. Screening  Lung cancer screening. You may have this screening every year starting at age 71 if you have a 30-pack-year history of smoking and currently smoke or have quit within the past 15 years.  Prostate cancer screening. Recommendations will vary depending on your family history and other risks.  Colorectal cancer screening. All adults should have this screening starting at age 17 and continuing until age 31. Your health care provider may recommend screening at age 72 if you are at increased risk. You will have tests every 1-10  years, depending on your results and the type of screening test.  Diabetes screening. This is done by checking your blood sugar (glucose) after you have not eaten for a while (fasting). You may have this done every 1-3 years.  Sexually transmitted disease (STD) testing. Follow these instructions at home: Eating and drinking  Eat a diet that includes  fresh fruits and vegetables, whole grains, lean protein, and low-fat dairy products.  Take vitamin and mineral supplements as recommended by your health care provider.  Do not drink alcohol if your health care provider tells you not to drink.  If you drink alcohol: ? Limit how much you have to 0-2 drinks a day. ? Be aware of how much alcohol is in your drink. In the [place] one drink equals one 12 oz bottle of beer (355 mL), one 5 oz glass of wine (148 mL), or one 1 oz glass of hard liquor (44 mL). Lifestyle  Take daily care of your teeth and gums.  Stay active. Exercise for at least 30 minutes on 5 or more days each week.  Do not use any products that contain nicotine or tobacco, such as cigarettes, e-cigarettes, and chewing tobacco. If you need help quitting, ask your health care provider.  If you are sexually active, practice safe sex. Use a condom or other form of protection to prevent STIs (sexually transmitted infections).  Talk with your health care provider about taking a low-dose aspirin every day starting at age 60. What's next?  Go to your health care provider once a year for a well check visit.  Ask your health care provider how often you should have your eyes and teeth checked.  Stay up to date on all vaccines. This information is not intended to replace advice given to you by your health care provider. Make sure you discuss any questions you have with your health care provider. Document Revised: 08/27/2018 Document Reviewed: 08/27/2018 Elsevier Patient Education  2020 [employer].

## 2020-01-13 NOTE — Addendum Note (Signed)
Addended by: Bonnye Fava on: 01/13/2020 03:20 PM   Modules accepted: Orders

## 2020-01-14 LAB — COMPREHENSIVE METABOLIC PANEL
ALT: 14 U/L (ref 0–53)
AST: 14 U/L (ref 0–37)
Albumin: 4.7 g/dL (ref 3.5–5.2)
Alkaline Phosphatase: 62 U/L (ref 39–117)
BUN: 14 mg/dL (ref 6–23)
CO2: 31 mEq/L (ref 19–32)
Calcium: 9.7 mg/dL (ref 8.4–10.5)
Chloride: 102 mEq/L (ref 96–112)
Creatinine, Ser: 1.06 mg/dL (ref 0.40–1.50)
GFR: 75.53 mL/min (ref >60.00)
Glucose, Bld: 87 mg/dL (ref 70–99)
Potassium: 4.2 mEq/L (ref 3.5–5.1)
Sodium: 140 mEq/L (ref 135–145)
Total Bilirubin: 0.6 mg/dL (ref 0.2–1.2)
Total Protein: 6.7 g/dL (ref 6.0–8.3)

## 2020-01-14 LAB — CBC WITH DIFFERENTIAL/PLATELET
Basophils Absolute: 0 10*3/uL (ref 0.0–0.1)
Basophils Relative: 0.8 % (ref 0.0–3.0)
Eosinophils Absolute: 0.1 10*3/uL (ref 0.0–0.7)
Eosinophils Relative: 1.2 % (ref 0.0–5.0)
HCT: 43.4 % (ref 39.0–52.0)
Hemoglobin: 14.9 g/dL (ref 13.0–17.0)
Lymphocytes Relative: 29.5 % (ref 12.0–46.0)
Lymphs Abs: 1.8 10*3/uL (ref 0.7–4.0)
MCHC: 34.5 g/dL (ref 30.0–36.0)
MCV: 91.2 fl (ref 78.0–100.0)
Monocytes Absolute: 0.4 10*3/uL (ref 0.1–1.0)
Monocytes Relative: 6.4 % (ref 3.0–12.0)
Neutro Abs: 3.8 10*3/uL (ref 1.4–7.7)
Neutrophils Relative %: 62.1 % (ref 43.0–77.0)
Platelets: 224 10*3/uL (ref 150.0–400.0)
RBC: 4.75 Mil/uL (ref 4.22–5.81)
RDW: 13.3 % (ref 11.5–15.5)
WBC: 6.1 10*3/uL (ref 4.0–10.5)

## 2020-01-14 LAB — LIPID PANEL
Cholesterol: 174 mg/dL (ref 0–200)
HDL: 58.4 mg/dL (ref >39.00)
LDL Cholesterol: 103 mg/dL — ABNORMAL HIGH (ref 0–99)
NonHDL: 115.52
Total CHOL/HDL Ratio: 3
Triglycerides: 65 mg/dL (ref 0.0–149.0)
VLDL: 13 mg/dL (ref 0.0–40.0)

## 2020-01-14 LAB — HEMOGLOBIN A1C: Hgb A1c MFr Bld: 5.1 % (ref 4.6–6.5)

## 2020-01-14 LAB — TSH: TSH: 1.01 u[IU]/mL (ref 0.35–4.50)

## 2020-01-14 LAB — VITAMIN B12: Vitamin B-12: 389 pg/mL (ref 211–911)

## 2020-01-14 LAB — PSA: PSA: 0.69 ng/mL (ref 0.10–4.00)

## 2020-01-14 LAB — VITAMIN D 25 HYDROXY (VIT D DEFICIENCY, FRACTURES): VITD: 30.09 ng/mL (ref 30.00–100.00)

## 2021-01-15 ENCOUNTER — Other Ambulatory Visit: Payer: Self-pay

## 2021-01-16 ENCOUNTER — Encounter: Payer: Self-pay | Admitting: Internal Medicine

## 2021-01-16 ENCOUNTER — Ambulatory Visit (INDEPENDENT_AMBULATORY_CARE_PROVIDER_SITE_OTHER): Payer: BC Managed Care – PPO | Admitting: Internal Medicine

## 2021-01-16 VITALS — BP 110/80 | HR 81 | Temp 98.5°F | Ht 71.5 in | Wt 213.3 lb

## 2021-01-16 DIAGNOSIS — Z0000 Encounter for general adult medical examination without abnormal findings: Secondary | ICD-10-CM

## 2021-01-16 DIAGNOSIS — Z1211 Encounter for screening for malignant neoplasm of colon: Secondary | ICD-10-CM

## 2021-01-16 NOTE — Progress Notes (Signed)
Established Patient Office Visit     This visit occurred during the SARS-CoV-2 public health emergency.  Safety protocols were in place, including screening questions prior to the visit, additional usage of staff PPE, and extensive cleaning of exam room while observing appropriate contact time as indicated for disinfecting solutions.    CC/Reason for Visit: Annual preventive exam  HPI: Johnny Dean is a 45 y.o. male who is coming in today for the above mentioned reasons.  He has no past medical history of significance.  He has no concerns today.  He has had 2 COVID vaccines but no booster yet, he has routine eye care but no dental care.  He is overdue for initial screening colonoscopy.  Past Medical/Surgical History: Past Medical History:  Diagnosis Date  . History of kidney stones     Past Surgical History:  Procedure Laterality Date  . EXTRACORPOREAL SHOCK WAVE LITHOTRIPSY Left 10/10/2016   Procedure: EXTRACORPOREAL SHOCK WAVE LITHOTRIPSY (ESWL);  Surgeon: Crist Fat, MD;  Location: [facility];  Service: Urology;  Laterality: Left;  . LITHOTRIPSY    . NASAL SINUS SURGERY    . PERCUTANEOUS PINNING Right 02/27/2013   Procedure: PERCUTANEOUS PINNING EXTREMITY;  Surgeon: Sharma Covert, MD;  Location: [facility] OR;  Service: Orthopedics;  Laterality: Right;    Social History:  reports that he has never smoked. He uses smokeless tobacco. He reports current alcohol use. He reports that he does not use drugs.  Allergies: Allergies  Allergen Reactions  . Shrimp [Shellfish Allergy]     Has been so long not sure of reaction. Will not touch it now.    Family History:  Family History  Problem Relation Age of Onset  . Hypertension Father      Current Outpatient Medications:  Marland Kitchen  Multiple Vitamin (MULTIVITAMIN WITH MINERALS) TABS tablet, Take 1 tablet by mouth daily., Disp: , Rfl:   Review of Systems:  Constitutional: Denies fever, chills, diaphoresis, appetite change  and fatigue.  HEENT: Denies photophobia, eye pain, redness, hearing loss, ear pain, congestion, sore throat, rhinorrhea, sneezing, mouth sores, trouble swallowing, neck pain, neck stiffness and tinnitus.   Respiratory: Denies SOB, DOE, cough, chest tightness,  and wheezing.   Cardiovascular: Denies chest pain, palpitations and leg swelling.  Gastrointestinal: Denies nausea, vomiting, abdominal pain, diarrhea, constipation, blood in stool and abdominal distention.  Genitourinary: Denies dysuria, urgency, frequency, hematuria, flank pain and difficulty urinating.  Endocrine: Denies: hot or cold intolerance, sweats, changes in hair or nails, polyuria, polydipsia. Musculoskeletal: Denies myalgias, back pain, joint swelling, arthralgias and gait problem.  Skin: Denies pallor, rash and wound.  Neurological: Denies dizziness, seizures, syncope, weakness, light-headedness, numbness and headaches.  Hematological: Denies adenopathy. Easy bruising, personal or family bleeding history  Psychiatric/Behavioral: Denies suicidal ideation, mood changes, confusion, nervousness, sleep disturbance and agitation    Physical Exam: Vitals:   01/16/21 1456  BP: 110/80  Pulse: 81  Temp: 98.5 F (36.9 C)  TempSrc: Oral  SpO2: 96%  Weight: 213 lb 4.8 oz (96.8 kg)  Height: 5' 11.5" (1.816 m)    Body mass index is 29.33 kg/m.   Constitutional: NAD, calm, comfortable Eyes: PERRL, lids and conjunctivae normal ENMT: Mucous membranes are moist. Posterior pharynx clear of any exudate or lesions. Normal dentition. Tympanic membrane is pearly white, no erythema or bulging. Neck: normal, supple, no masses, no thyromegaly Respiratory: clear to auscultation bilaterally, no wheezing, no crackles. Normal respiratory effort. No accessory muscle use.  Cardiovascular: Regular rate  and rhythm, no murmurs / rubs / gallops. No extremity edema. 2+ pedal pulses. No carotid bruits.  Abdomen: no tenderness, no masses palpated.  No hepatosplenomegaly. Bowel sounds positive.  Musculoskeletal: no clubbing / cyanosis. No joint deformity upper and lower extremities. Good ROM, no contractures. Normal muscle tone.  Skin: no rashes, lesions, ulcers. No induration Neurologic: CN 2-12 grossly intact. Sensation intact, DTR normal. Strength 5/5 in all 4.  Psychiatric: Normal judgment and insight. Alert and oriented x 3. Normal mood.    Impression and Plan:  Encounter for preventive health examination  -Advised routine eye and dental care. -He is due for his COVID booster but otherwise immunizations are up-to-date. -Refer to GI for initial screening colonoscopy. -He had a normal PSA in 2021, after shared decision making we have decided not to pursue prostate cancer screening this visit. -Screening labs today. -Healthy lifestyle discussed in detail.  Colon cancer screening  - Plan: Ambulatory referral to Gastroenterology    Patient Instructions   -Nice seeing you today!!  -Lab work today; will notify you once results are available.  -Remember to get your COVID booster.  -Schedule follow up in 1 year or sooner as needed.   Preventive Care 39-59 Years Old, Male Preventive care refers to lifestyle choices and visits with your health care provider that can promote health and wellness. This includes:  A yearly physical exam. This is also called an annual wellness visit.  Regular dental and eye exams.  Immunizations.  Screening for certain conditions.  Healthy lifestyle choices, such as: ? Eating a healthy diet. ? Getting regular exercise. ? Not using drugs or products that contain nicotine and tobacco. ? Limiting alcohol use. What can I expect for my preventive care visit? Physical exam Your health care provider will check your:  Height and weight. These may be used to calculate your BMI (body mass index). BMI is a measurement that tells if you are at a healthy weight.  Heart rate and blood  pressure.  Body temperature.  Skin for abnormal spots. Counseling Your health care provider may ask you questions about your:  Past medical problems.  Family's medical history.  Alcohol, tobacco, and drug use.  Emotional well-being.  Home life and relationship well-being.  Sexual activity.  Diet, exercise, and sleep habits.  Work and work Astronomer.  Access to firearms. What immunizations do I need? Vaccines are usually given at various ages, according to a schedule. Your health care provider will recommend vaccines for you based on your age, medical history, and lifestyle or other factors, such as travel or where you work.   What tests do I need? Blood tests  Lipid and cholesterol levels. These may be checked every 5 years, or more often if you are over 48 years old.  Hepatitis C test.  Hepatitis B test. Screening  Lung cancer screening. You may have this screening every year starting at age 22 if you have a 30-pack-year history of smoking and currently smoke or have quit within the past 15 years.  Prostate cancer screening. Recommendations will vary depending on your family history and other risks.  Genital exam to check for testicular cancer or hernias.  Colorectal cancer screening. ? All adults should have this screening starting at age 26 and continuing until age 40. ? Your health care provider may recommend screening at age 78 if you are at increased risk. ? You will have tests every 1-10 years, depending on your results and the type of screening test.  Diabetes screening. ? This is done by checking your blood sugar (glucose) after you have not eaten for a while (fasting). ? You may have this done every 1-3 years.  STD (sexually transmitted disease) testing, if you are at risk. Follow these instructions at home: Eating and drinking  Eat a diet that includes fresh fruits and vegetables, whole grains, lean protein, and low-fat dairy products.  Take  vitamin and mineral supplements as recommended by your health care provider.  Do not drink alcohol if your health care provider tells you not to drink.  If you drink alcohol: ? Limit how much you have to 0-2 drinks a day. ? Be aware of how much alcohol is in your drink. In the [place] one drink equals one 12 oz bottle of beer (355 mL), one 5 oz glass of wine (148 mL), or one 1 oz glass of hard liquor (44 mL).   Lifestyle  Take daily care of your teeth and gums. Brush your teeth every morning and night with fluoride toothpaste. Floss one time each day.  Stay active. Exercise for at least 30 minutes 5 or more days each week.  Do not use any products that contain nicotine or tobacco, such as cigarettes, e-cigarettes, and chewing tobacco. If you need help quitting, ask your health care provider.  Do not use drugs.  If you are sexually active, practice safe sex. Use a condom or other form of protection to prevent STIs (sexually transmitted infections).  If told by your health care provider, take low-dose aspirin daily starting at age 32.  Find healthy ways to cope with stress, such as: ? Meditation, yoga, or listening to music. ? Journaling. ? Talking to a trusted person. ? Spending time with friends and family. Safety  Always wear your seat belt while driving or riding in a vehicle.  Do not drive: ? If you have been drinking alcohol. Do not ride with someone who has been drinking. ? When you are tired or distracted. ? While texting.  Wear a helmet and other protective equipment during sports activities.  If you have firearms in your house, make sure you follow all gun safety procedures. What's next?  Go to your health care provider once a year for an annual wellness visit.  Ask your health care provider how often you should have your eyes and teeth checked.  Stay up to date on all vaccines. This information is not intended to replace advice given to you by your health care  provider. Make sure you discuss any questions you have with your health care provider. Document Revised: 06/01/2019 Document Reviewed: 08/27/2018 Elsevier Patient Education  2021 [employer]vier Inc.      Chaya Jan, MD [facility]

## 2021-01-16 NOTE — Patient Instructions (Signed)
-Nice seeing you today!!  -Lab work today; will notify you once results are available.  -Remember to get your COVID booster.  -Schedule follow up in 1 year or sooner as needed.   Preventive Care 76-46 Years Old, Male Preventive care refers to lifestyle choices and visits with your health care provider that can promote health and wellness. This includes:  A yearly physical exam. This is also called an annual wellness visit.  Regular dental and eye exams.  Immunizations.  Screening for certain conditions.  Healthy lifestyle choices, such as: ? Eating a healthy diet. ? Getting regular exercise. ? Not using drugs or products that contain nicotine and tobacco. ? Limiting alcohol use. What can I expect for my preventive care visit? Physical exam Your health care provider will check your:  Height and weight. These may be used to calculate your BMI (body mass index). BMI is a measurement that tells if you are at a healthy weight.  Heart rate and blood pressure.  Body temperature.  Skin for abnormal spots. Counseling Your health care provider may ask you questions about your:  Past medical problems.  Family's medical history.  Alcohol, tobacco, and drug use.  Emotional well-being.  Home life and relationship well-being.  Sexual activity.  Diet, exercise, and sleep habits.  Work and work Astronomer.  Access to firearms. What immunizations do I need? Vaccines are usually given at various ages, according to a schedule. Your health care provider will recommend vaccines for you based on your age, medical history, and lifestyle or other factors, such as travel or where you work.   What tests do I need? Blood tests  Lipid and cholesterol levels. These may be checked every 5 years, or more often if you are over 59 years old.  Hepatitis C test.  Hepatitis B test. Screening  Lung cancer screening. You may have this screening every year starting at age 53 if you have  a 30-pack-year history of smoking and currently smoke or have quit within the past 15 years.  Prostate cancer screening. Recommendations will vary depending on your family history and other risks.  Genital exam to check for testicular cancer or hernias.  Colorectal cancer screening. ? All adults should have this screening starting at age 61 and continuing until age 53. ? Your health care provider may recommend screening at age 97 if you are at increased risk. ? You will have tests every 1-10 years, depending on your results and the type of screening test.  Diabetes screening. ? This is done by checking your blood sugar (glucose) after you have not eaten for a while (fasting). ? You may have this done every 1-3 years.  STD (sexually transmitted disease) testing, if you are at risk. Follow these instructions at home: Eating and drinking  Eat a diet that includes fresh fruits and vegetables, whole grains, lean protein, and low-fat dairy products.  Take vitamin and mineral supplements as recommended by your health care provider.  Do not drink alcohol if your health care provider tells you not to drink.  If you drink alcohol: ? Limit how much you have to 0-2 drinks a day. ? Be aware of how much alcohol is in your drink. In the [place] one drink equals one 12 oz bottle of beer (355 mL), one 5 oz glass of wine (148 mL), or one 1 oz glass of hard liquor (44 mL).   Lifestyle  Take daily care of your teeth and gums. Brush your teeth every morning  and night with fluoride toothpaste. Floss one time each day.  Stay active. Exercise for at least 30 minutes 5 or more days each week.  Do not use any products that contain nicotine or tobacco, such as cigarettes, e-cigarettes, and chewing tobacco. If you need help quitting, ask your health care provider.  Do not use drugs.  If you are sexually active, practice safe sex. Use a condom or other form of protection to prevent STIs (sexually transmitted  infections).  If told by your health care provider, take low-dose aspirin daily starting at age 72.  Find healthy ways to cope with stress, such as: ? Meditation, yoga, or listening to music. ? Journaling. ? Talking to a trusted person. ? Spending time with friends and family. Safety  Always wear your seat belt while driving or riding in a vehicle.  Do not drive: ? If you have been drinking alcohol. Do not ride with someone who has been drinking. ? When you are tired or distracted. ? While texting.  Wear a helmet and other protective equipment during sports activities.  If you have firearms in your house, make sure you follow all gun safety procedures. What's next?  Go to your health care provider once a year for an annual wellness visit.  Ask your health care provider how often you should have your eyes and teeth checked.  Stay up to date on all vaccines. This information is not intended to replace advice given to you by your health care provider. Make sure you discuss any questions you have with your health care provider. Document Revised: 06/01/2019 Document Reviewed: 08/27/2018 Elsevier Patient Education  2021 [employer].

## 2021-01-16 NOTE — Addendum Note (Signed)
Addended by: Leonette Nutting on: 01/16/2021 03:45 PM   Modules accepted: Orders

## 2021-01-17 LAB — LIPID PANEL
Cholesterol: 167 mg/dL (ref 0–200)
HDL: 61.9 mg/dL (ref >39.00)
LDL Cholesterol: 86 mg/dL (ref 0–99)
NonHDL: 105.44
Total CHOL/HDL Ratio: 3
Triglycerides: 96 mg/dL (ref 0.0–149.0)
VLDL: 19.2 mg/dL (ref 0.0–40.0)

## 2021-01-17 LAB — CBC WITH DIFFERENTIAL/PLATELET
Basophils Absolute: 0.1 10*3/uL (ref 0.0–0.1)
Basophils Relative: 1.1 % (ref 0.0–3.0)
Eosinophils Absolute: 0.1 10*3/uL (ref 0.0–0.7)
Eosinophils Relative: 1.2 % (ref 0.0–5.0)
HCT: 43.8 % (ref 39.0–52.0)
Hemoglobin: 15.2 g/dL (ref 13.0–17.0)
Lymphocytes Relative: 19.6 % (ref 12.0–46.0)
Lymphs Abs: 1.4 10*3/uL (ref 0.7–4.0)
MCHC: 34.6 g/dL (ref 30.0–36.0)
MCV: 89.9 fl (ref 78.0–100.0)
Monocytes Absolute: 0.5 10*3/uL (ref 0.1–1.0)
Monocytes Relative: 6.5 % (ref 3.0–12.0)
Neutro Abs: 5.1 10*3/uL (ref 1.4–7.7)
Neutrophils Relative %: 71.6 % (ref 43.0–77.0)
Platelets: 242 10*3/uL (ref 150.0–400.0)
RBC: 4.87 Mil/uL (ref 4.22–5.81)
RDW: 13 % (ref 11.5–15.5)
WBC: 7.2 10*3/uL (ref 4.0–10.5)

## 2021-01-17 LAB — COMPREHENSIVE METABOLIC PANEL
ALT: 16 U/L (ref 0–53)
AST: 14 U/L (ref 0–37)
Albumin: 4.6 g/dL (ref 3.5–5.2)
Alkaline Phosphatase: 69 U/L (ref 39–117)
BUN: 16 mg/dL (ref 6–23)
CO2: 31 mEq/L (ref 19–32)
Calcium: 10 mg/dL (ref 8.4–10.5)
Chloride: 103 mEq/L (ref 96–112)
Creatinine, Ser: 1.15 mg/dL (ref 0.40–1.50)
GFR: 76.56 mL/min (ref >60.00)
Glucose, Bld: 87 mg/dL (ref 70–99)
Potassium: 4.1 mEq/L (ref 3.5–5.1)
Sodium: 141 mEq/L (ref 135–145)
Total Bilirubin: 0.6 mg/dL (ref 0.2–1.2)
Total Protein: 6.7 g/dL (ref 6.0–8.3)

## 2021-01-17 LAB — HEMOGLOBIN A1C: Hgb A1c MFr Bld: 5.1 % (ref 4.6–6.5)

## 2021-01-17 LAB — VITAMIN D 25 HYDROXY (VIT D DEFICIENCY, FRACTURES): VITD: 28.24 ng/mL — ABNORMAL LOW (ref 30.00–100.00)

## 2021-01-17 LAB — TSH: TSH: 0.64 u[IU]/mL (ref 0.35–4.50)

## 2021-01-17 LAB — VITAMIN B12: Vitamin B-12: 827 pg/mL (ref 211–911)

## 2021-01-18 ENCOUNTER — Other Ambulatory Visit: Payer: Self-pay | Admitting: Internal Medicine

## 2021-01-18 ENCOUNTER — Encounter: Payer: Self-pay | Admitting: Internal Medicine

## 2021-01-18 DIAGNOSIS — E559 Vitamin D deficiency, unspecified: Secondary | ICD-10-CM

## 2021-01-18 MED ORDER — VITAMIN D (ERGOCALCIFEROL) 1.25 MG (50000 UNIT) PO CAPS: 50000.0000 [IU] | ORAL_CAPSULE | ORAL | 0 refills | Status: AC

## 2022-01-17 ENCOUNTER — Encounter: Payer: BC Managed Care – PPO | Admitting: Internal Medicine

## 2023-09-23 ENCOUNTER — Telehealth: Payer: Self-pay | Admitting: Internal Medicine

## 2023-09-23 NOTE — Telephone Encounter (Signed)
 LVM for pt to schedule a follow-up or physical--pt is due a physical.    **If pt has new pcp, please remove Ardyth Harps as pcp.
